# Patient Record
Sex: Female | Born: 1955
Health system: Southern US, Community
[De-identification: ages and names within clinical notes are randomized; demographics above are authoritative.]

## PROBLEM LIST (undated history)

## (undated) DIAGNOSIS — I10 Essential (primary) hypertension: Secondary | ICD-10-CM

## (undated) DIAGNOSIS — E785 Hyperlipidemia, unspecified: Secondary | ICD-10-CM

## (undated) HISTORY — DX: Hyperlipidemia, unspecified: E78.5

## (undated) HISTORY — DX: Essential (primary) hypertension: I10

## (undated) HISTORY — PX: OTHER SURGICAL HISTORY: SHX169

---

## 2003-01-19 LAB — HM COLONOSCOPY: HM Colonoscopy: NORMAL

## 2006-12-09 ENCOUNTER — Observation Stay (HOSPITAL_COMMUNITY): Admission: EM | Admit: 2006-12-09 | Discharge: 2006-12-10 | Payer: Self-pay | Admitting: Emergency Medicine

## 2006-12-20 ENCOUNTER — Ambulatory Visit: Payer: Self-pay

## 2009-03-03 ENCOUNTER — Encounter: Payer: Self-pay | Admitting: Family Medicine

## 2009-03-24 ENCOUNTER — Ambulatory Visit: Payer: Self-pay | Admitting: Family Medicine

## 2009-03-24 DIAGNOSIS — E119 Type 2 diabetes mellitus without complications: Secondary | ICD-10-CM | POA: Insufficient documentation

## 2009-03-24 DIAGNOSIS — E785 Hyperlipidemia, unspecified: Secondary | ICD-10-CM | POA: Insufficient documentation

## 2009-03-24 DIAGNOSIS — N39498 Other specified urinary incontinence: Secondary | ICD-10-CM | POA: Insufficient documentation

## 2009-03-24 DIAGNOSIS — I10 Essential (primary) hypertension: Secondary | ICD-10-CM | POA: Insufficient documentation

## 2009-03-24 LAB — CONVERTED CEMR LAB
Bilirubin Urine: NEGATIVE
Glucose, Urine, Semiquant: NEGATIVE
Ketones, urine, test strip: NEGATIVE
Specific Gravity, Urine: 1.015

## 2009-03-26 ENCOUNTER — Encounter: Payer: Self-pay | Admitting: Family Medicine

## 2009-03-26 ENCOUNTER — Encounter (INDEPENDENT_AMBULATORY_CARE_PROVIDER_SITE_OTHER): Payer: Self-pay | Admitting: *Deleted

## 2009-03-26 LAB — CONVERTED CEMR LAB: Casts: NONE SEEN /lpf

## 2009-04-16 ENCOUNTER — Telehealth: Payer: Self-pay | Admitting: Family Medicine

## 2009-05-12 ENCOUNTER — Encounter: Payer: Self-pay | Admitting: Family Medicine

## 2009-08-15 ENCOUNTER — Ambulatory Visit: Payer: Self-pay | Admitting: Family Medicine

## 2009-08-22 ENCOUNTER — Ambulatory Visit: Payer: Self-pay | Admitting: Family Medicine

## 2009-08-25 LAB — CONVERTED CEMR LAB
AST: 16 units/L (ref 0–37)
Alkaline Phosphatase: 82 units/L (ref 39–117)
BUN: 18 mg/dL (ref 6–23)
Creatinine, Ser: 0.74 mg/dL (ref 0.40–1.20)
Direct LDL: 123 mg/dL — ABNORMAL HIGH
Glucose, Bld: 111 mg/dL — ABNORMAL HIGH (ref 70–99)

## 2009-10-21 ENCOUNTER — Telehealth: Payer: Self-pay | Admitting: Family Medicine

## 2009-11-17 ENCOUNTER — Telehealth: Payer: Self-pay | Admitting: Family Medicine

## 2009-11-26 ENCOUNTER — Encounter: Admission: RE | Admit: 2009-11-26 | Discharge: 2009-11-26 | Payer: Self-pay | Admitting: Family Medicine

## 2009-12-22 ENCOUNTER — Ambulatory Visit: Payer: Self-pay | Admitting: Family Medicine

## 2009-12-22 DIAGNOSIS — R0989 Other specified symptoms and signs involving the circulatory and respiratory systems: Secondary | ICD-10-CM

## 2009-12-22 DIAGNOSIS — R0609 Other forms of dyspnea: Secondary | ICD-10-CM

## 2009-12-22 DIAGNOSIS — F341 Dysthymic disorder: Secondary | ICD-10-CM

## 2009-12-22 DIAGNOSIS — F339 Major depressive disorder, recurrent, unspecified: Secondary | ICD-10-CM | POA: Insufficient documentation

## 2009-12-22 LAB — CONVERTED CEMR LAB
Creatinine,U: 300 mg/dL
Hgb A1c MFr Bld: 5.9 %
Microalbumin U total vol: 10 mg/L

## 2009-12-24 ENCOUNTER — Encounter: Payer: Self-pay | Admitting: Family Medicine

## 2010-01-04 ENCOUNTER — Encounter: Payer: Self-pay | Admitting: Family Medicine

## 2010-01-07 ENCOUNTER — Telehealth: Payer: Self-pay | Admitting: Family Medicine

## 2010-01-14 ENCOUNTER — Ambulatory Visit: Payer: Self-pay | Admitting: Family Medicine

## 2010-02-08 ENCOUNTER — Encounter: Payer: Self-pay | Admitting: Emergency Medicine

## 2010-02-12 ENCOUNTER — Telehealth: Payer: Self-pay | Admitting: Family Medicine

## 2010-02-17 NOTE — Letter (Signed)
Summary: Primary Care Consult Scheduled Letter  [place] [facility]  [address]   [place], [place] [address]   Phone: [phone]  Fax: [phone]      03/26/2009 MRN: [id number]  [facility] [address] [place], [place]  [address]    Dear Ms. Blakney,      We have scheduled an appointment for you.  At the recommendation of Dr._Metheney , we have scheduled you a consult with [facility],[place]  on MARCH 30TH  at 11AM .  Their address is_[address]  [address], [place] . The office phone number is [phone] .  If this appointment day and time is not convenient for you, please feel free to call the office of the doctor you are being referred to at the number listed above and reschedule the appointment.     It is important for you to keep your scheduled appointments. We are here to make sure you are given good patient care. If you have questions or you have made changes to your appointment, please notify us at  [phone], ask for HELEN.    Thank you,  Patient Care Coordinator [place] [facility]

## 2010-02-17 NOTE — Progress Notes (Signed)
Summary: Metformin ?  Phone Note Call from Patient   Summary of Call: Pt would like to know when she should take her Metformin. She thinks she is to be taking 500mg  two times a day but just needs to know when the best time to take. Please advise. Initial call taken by: Payton Spark CMA,  April 16, 2009 3:27 PM  Follow-up for Phone Call        one in AM with breakfast adn second with dinner.  Follow-up by: Nani Gasser MD,  April 16, 2009 7:13 PM  Additional Follow-up for Phone Call Additional follow up Details #1::        Pt notified of MD instructions of Metformin Additional Follow-up by: Kathlene November,  April 17, 2009 9:02 AM

## 2010-02-17 NOTE — Assessment & Plan Note (Signed)
Summary: HTN, IFG   Vital Signs:  Patient profile:   55 year old female Height:      63 inches Weight:      145 pounds Pulse rate:   88 / minute BP sitting:   117 / 73  (left arm) Cuff size:   regular  Vitals Entered By: Avon Gully CMA, Duncan Dull) (August 22, 2009 8:38 AM) CC: f/u Bp and labs, Hypertension Management  Vision Screening:      Vision Comments: 06/2010   Primary Care Provider:  Nani Gasser, MD  CC:  f/u Bp and labs and Hypertension Management.  History of Present Illness: Did go to the diabetic classes.  Has really been work in gon her diet.  Was due for A1C.  Had her eye exam about 2 months ago.     Diabetes Management History:      She says that she is exercising.  Type of exercise includes: treadmill.  Duration of exercise is estimated to be 30-60  She is doing this 3 times per week.    Hypertension History:      She denies headache, chest pain, palpitations, dyspnea with exertion, orthopnea, PND, peripheral edema, visual symptoms, neurologic problems, syncope, and side effects from treatment.  She notes no problems with any antihypertensive medication side effects.        Positive major cardiovascular risk factors include female age 48 years old or older, hyperlipidemia, hypertension, and current tobacco user.     Current Medications (verified): 1)  Lisinopril 10 Mg Tabs (Lisinopril) .... Take One Tablet By Mouth Once A Day 2)  Lipitor 80 Mg Tabs (Atorvastatin Calcium) .... Take One Tablet By Mouth Daily 3)  Metformin Hcl 500 Mg Tabs (Metformin Hcl) .... Take One Tablet By Mouth Twice A Day 4)  Prempro 0.625-5 Mg Tabs (Conj Estrog-Medroxyprogest Ace) .... Take Three Times A Week 5)  Venlafaxine Hcl 75 Mg Xr24h-Tab (Venlafaxine Hcl) .... Take 1 Tablet By Mouth Once A Day  Allergies (verified): 1)  ! Codeine  Comments:  Nurse/Medical Assistant: The patient's medications and allergies were reviewed with the patient and were updated in the  Medication and Allergy Lists. Avon Gully CMA, Duncan Dull) (August 22, 2009 8:39 AM)  Physical Exam  General:  Well-developed,well-nourished,in no acute distress; alert,appropriate and cooperative throughout examination Lungs:  Normal respiratory effort, chest expands symmetrically. Lungs are clear to auscultation, no crackles or wheezes. Heart:  Normal rate and regular rhythm. S1 and S2 normal without gallop, murmur, click, rub or other extra sounds. Skin:  no rashes.   Psych:  Cognition and judgment appear intact. Alert and cooperative with normal attention span and concentration. No apparent delusions, illusions, hallucinations  Diabetes Management Exam:    Eye Exam:       Eye Exam done elsewhere          Date: 06/18/2009          Results: normal          Done by: Dr. Clearance Coots   Impression & Recommendations:  Problem # 1:  HYPERTENSION, BENIGN (ICD-401.1) Exam is normal. BP looks great.  Her updated medication list for this problem includes:    Lisinopril 10 Mg Tabs (Lisinopril) .Marland Kitchen... Take one tablet by mouth once a day  Orders: T-Comprehensive Metabolic Panel ([id number])  Problem # 2:  INSULIN RESISTANCE SYNDROME (ICD-259.8) Due for A1C. Eye exam is up to date. Doing well on the metform iwht no SE.  Will check microalbumin at the next OV.  Orders: T- * Misc. Laboratory test [phone])  Complete Medication List: 1)  Lisinopril 10 Mg Tabs (Lisinopril) .... Take one tablet by mouth once a day 2)  Lipitor 80 Mg Tabs (Atorvastatin calcium) .... Take one tablet by mouth daily 3)  Metformin Hcl 500 Mg Tabs (Metformin hcl) .... Take one tablet by mouth twice a day 4)  Prempro 0.625-5 Mg Tabs (Conj estrog-medroxyprogest ace) .... Take three times a week 5)  Venlafaxine Hcl 75 Mg Xr24h-tab (Venlafaxine hcl) .... Take 1 tablet by mouth once a day 6)  Delica Lancets.  .... Dx: 250.00 test 1 x a day.  Other Orders: T-LDL Direct [phone])  Hypertension Assessment/Plan:      The  patient's hypertensive risk group is category B: At least one risk factor (excluding diabetes) with no target organ damage.  Today's blood pressure is 117/73.    Patient Instructions: 1)  Please schedule a follow-up appointment in 3 months for sugars.  Prescriptions: DELICA LANCETS. Dx: 250.00 Test 1 x a day.  #90 x 3   Entered and Authorized by:   Nani Gasser MD   Signed by:   Nani Gasser MD on 08/22/2009   Method used:   Printed then faxed to ...       [facility] (mail-order)             , [place]         Ph: [phone]       Fax: [phone]   RxID:   [id number] VENLAFAXINE HCL 75 MG XR24H-TAB (VENLAFAXINE HCL) Take 1 tablet by mouth once a day  #90 x 3   Entered and Authorized by:   Nani Gasser MD   Signed by:   Nani Gasser MD on 08/22/2009   Method used:   Printed then faxed to ...       [facility] (mail-order)             , [place]         Ph: [phone]       Fax: [phone]   RxID:   [id number] LIPITOR 80 MG TABS (ATORVASTATIN CALCIUM) Take one tablet by mouth daily  #90 x 3   Entered and Authorized by:   Nani Gasser MD   Signed by:   Nani Gasser MD on 08/22/2009   Method used:   Printed then faxed to ...       [facility] (mail-order)             , [place]         Ph: [phone]       Fax: [phone]   RxID:   [id number]

## 2010-02-17 NOTE — Letter (Signed)
Summary: Patient Health Questionnaire, Anxiety Disorder Scale  Patient Health Questionnaire, Anxiety Disorder Scale   Imported By: Maryln Gottron 12/26/2009 14:21:49  _____________________________________________________________________  External Attachment:    Type:   Image     Comment:   External Document

## 2010-02-17 NOTE — Assessment & Plan Note (Signed)
Summary: follow-up blood sugars, mood, snoring   Vital Signs:  Patient profile:   55 year old female Height:      63 inches Weight:      146 pounds BMI:     25.96 Pulse rate:   83 / minute BP sitting:   131 / 77  (right arm) Cuff size:   regular  Vitals Entered By: Avon Gully CMA, Duncan Dull) (December 22, 2009 8:29 AM) CC: f/u fasting glucose   Primary Care Provider:  Nani Gasser, MD  CC:  f/u fasting glucose.  History of Present Illness: Taking venlafaxine for the last 2 years. Always has difficulty in the winter. But hasnt' felt well in the last 6 months. Feels she is depressed and anxious. Doesn't want to engage and get out of the house. Can't sleep.  she also feels very anxious and irritable especially at work.  She is having some difficulty with her husband mostly because she just doesn't want to engage her communicate very much.  She denies feeling suicidal.  She really notes her symptoms started to worsen over the last 6 months.  The last two weeks in particular have been more severe.she has not had any other mood medications in the past.her sleep quality is extremely poor.  Current Medications (verified): 1)  Lisinopril 10 Mg Tabs (Lisinopril) .... Take One Tablet By Mouth Once A Day 2)  Lipitor 80 Mg Tabs (Atorvastatin Calcium) .... Take One Tablet By Mouth Daily 3)  Metformin Hcl 500 Mg Xr24h-Tab (Metformin Hcl) .... Take 1 Tablet By Mouth Once A Day 4)  Prempro 0.625-5 Mg Tabs (Conj Estrog-Medroxyprogest Ace) .... Take Three Times A Week 5)  Venlafaxine Hcl 75 Mg Xr24h-Tab (Venlafaxine Hcl) .... Take 1 Tablet By Mouth Once A Day 6)  Delica Lancets. .... Dx: 250.00 Test 1 X A Day.  Allergies (verified): 1)  ! Codeine  Comments:  Nurse/Medical Assistant: The patient's medications and allergies were reviewed with the patient and were updated in the Medication and Allergy Lists. Avon Gully CMA, Duncan Dull) (December 22, 2009 8:29 AM)  Physical  Exam  General:  Well-developed,well-nourished,in no acute distress; alert,appropriate and cooperative throughout examination Lungs:  Normal respiratory effort, chest expands symmetrically. Lungs are clear to auscultation, no crackles or wheezes. Heart:  Normal rate and regular rhythm. S1 and S2 normal without gallop, murmur, click, rub or other extra sounds. Psych:  she is tearful on exam today.  Good eye contact.  She is not anxious appearing.   Impression & Recommendations:  Problem # 1:  INSULIN RESISTANCE SYNDROME (ICD-259.8) her A1c looks wonderful today.  I really intended to stop her metformin and see how she does.  Then she has stopped exercising of the last two months secondary to her mood.  When she is back into her regular routine exercising again hopefully as she feels more motivated than I would like to stop her metformin and see how she does. Orders: Fingerstick (82956) Creatinine  (21308) Hemoglobin A1C (83036) Urine Microalbumin (65784)  Problem # 2:  ANXIETY DEPRESSION (ICD-300.4) Assessment: New PHQ- 9 scorew 16 (mod severe) GAD-7 score 10. we discussed several different options.  We could change her to an SSRI, started mood stabilizer, or increase her venlafaxine.  After looking at her scores I decided to work on increasing her venlafaxine and for the short-term and it stabilizer in the evening to improve her sleep quality and help with her mood.  Actually the Seroquel will be for one to two  months as the increased and venlafaxine and helps her mood. if we can stay with this medication it will also help her perimenopausal symptoms.  If not then we will change to another SSRI.  She is not tried any other mood medications in the past.patient might agree to this particular care plan.  Follow-up in 3 weeks.  Problem # 3:  SNORING (ICD-786.09) per her report husband she snores nightly.  He feels it is gotten worse.  Recently her sister was diagnosed with sleep apnea.  She would  like to be tested.  All her make a for referral to schedule her for a sleep study. Her updated medication list for this problem includes:    Lisinopril 10 Mg Tabs (Lisinopril) .Marland Kitchen... Take one tablet by mouth once a day  Orders: Sleep Study (Sleep Study)  Complete Medication List: 1)  Lisinopril 10 Mg Tabs (Lisinopril) .... Take one tablet by mouth once a day 2)  Lipitor 80 Mg Tabs (Atorvastatin calcium) .... Take one tablet by mouth daily 3)  Metformin Hcl 500 Mg Xr24h-tab (Metformin hcl) .... Take 1 tablet by mouth once a day 4)  Prempro 0.625-2.5 Mg Tabs (Conj estrog-medroxyprogest ace) .... Take 1 tablet by mouth once a day 5)  Venlafaxine Hcl 150 Mg Xr24h-cap (Venlafaxine hcl) .... Take 1 tablet by mouth once a day 6)  Delica Lancets.  .... Dx: 250.00 test 1 x a day. 7)  Seroquel 50 Mg Tabs (Quetiapine fumarate) .... 1/2 tab at bedtime for one week then increase to whole tab.  Patient Instructions: 1)  Increase venlafaxine to 2 tabs daily and then when run out pick up rx for the 150mg  once a day tab 2)  Start the seroquel 1/2 tab after dinner time.  3)  Please schedule a follow-up appointment in 3 weeks.  Prescriptions: PREMPRO 0.625-2.5 MG TABS (CONJ ESTROG-MEDROXYPROGEST ACE) Take 1 tablet by mouth once a day  #90 x 3   Entered and Authorized by:   Nani Gasser MD   Signed by:   Nani Gasser MD on 12/22/2009   Method used:   Electronically to        [facility]* (retail)             ,          Ph: [phone]       Fax: [phone]   RxID:   [id number] SEROQUEL 50 MG TABS (QUETIAPINE FUMARATE) 1/2 tab at bedtime for one week then increase to whole tab.  #30 x 0   Entered and Authorized by:   Nani Gasser MD   Signed by:   Nani Gasser MD on 12/22/2009   Method used:   Electronically to        [facility]* (retail)       [address]       [place], [place]  [address]       Ph: [phone]       Fax: [phone]    RxID:   [id number] VENLAFAXINE HCL 150 MG XR24H-CAP (VENLAFAXINE HCL) Take 1 tablet by mouth once a day  #30 x 0   Entered and Authorized by:   Nani Gasser MD   Signed by:   Nani Gasser MD on 12/22/2009   Method used:   Electronically to        [facility]* (retail)       [address]       [place], [place]  [address]  Ph: [phone]       Fax: [phone]   RxID:   [id number]    Orders Added: 1)  Fingerstick [36416] 2)  Creatinine  [82570] 3)  Hemoglobin A1C [83036] 4)  Urine Microalbumin [82044] 5)  Sleep Study [Sleep Study] 6)  Est. Patient Level IV [62952]    Laboratory Results   Urine Tests  Date/Time Received: 12/22/09 Date/Time Reported: 12/22/09  Microalbumin (urine): 10 mg/L Creatinine: 300mg /dL  A:C Ratio 30mg /g  Blood Tests   Date/Time Received: 12/22/09 Date/Time Reported: 12/22/09  HGBA1C: 5.9%   (Normal Range: Non-Diabetic - 3-6%   Control Diabetic - 6-8%)

## 2010-02-17 NOTE — Assessment & Plan Note (Signed)
Summary: NOV: est care   Vital Signs:  Patient profile:   55 year old female Height:      63 inches Weight:      148 pounds BMI:     26.31 Pulse rate:   97 / minute BP sitting:   136 / 84  (left arm) Cuff size:   regular  Vitals Entered By: Kathlene November (March 24, 2009 4:12 PM) CC: Np- get established- discuss bladder control issues Is Patient Diabetic? Yes Did you bring your meter with you today? No   Primary Care Provider:  Nani Gasser, MD  CC:  Np- get established- discuss bladder control issues.  History of Present Illness: Np- get established- discuss bladder control issues. Started notes some sxs about 30 years ago.  Over the last few weeks noticed some leaking with cough, laughing and sneezing. over the last few weeks has noticed having more urgency. But this is new.  Works in [employer].     Habits & Providers  Alcohol-Tobacco-Diet     Alcohol drinks/day: 1     Alcohol type: wine     Tobacco Status: current     Cigarette Packs/Day: <0.25  Exercise-Depression-Behavior     Does Patient Exercise: yes     Type of exercise: treadmill     Exercise (avg: min/session): 30-60     Times/week: 3     STD Risk: never     Drug Use: no     Seat Belt Use: always  Current Medications (verified): 1)  Lisinopril 10 Mg Tabs (Lisinopril) .... Take One Tablet By Mouth Once A Day 2)  Lipitor 80 Mg Tabs (Atorvastatin Calcium) .... Take One Tablet By Mouth Daily 3)  Metformin Hcl 500 Mg Tabs (Metformin Hcl) .... Take One Tablet By Mouth Twice A Day 4)  Prempro 0.625-5 Mg Tabs (Conj Estrog-Medroxyprogest Ace) .... Take Three Times A Week 5)  Venlafaxine Hcl 75 Mg Tabs (Venlafaxine Hcl) .... Take One Tablet By Mouth Once A Day  Allergies (verified): 1)  ! 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 [employer]  diploma and some college. Married to [place]inkley with 2 kids.  Current Smoker Alcohol use-yes Drug use-no Regular exercise-yes 2 caffeinated drinks per day.  Smoking Status:  current Packs/Day:  <0.25 Does Patient Exercise:  yes STD Risk:  never Drug Use:  no Seat Belt Use:  always  Review of Systems       No fever/sweats/weakness, unexplained weight loss/gain.  + vison changes.  No difficulty hearing/ringing in ears, hay fever/allergies.  No chest pain/discomfort, palpitations.  No Br lump/nipple discharge.  No cough/wheeze.  No blood in BM, nausea/vomiting/diarrhea.  No nighttime urination, +  leaking urine, no unusual vaginal bleeding, discharge (penis or vagina).  No muscle/joint pain. No rash, change in mole.  No HA, memory loss.  + anxiety, no sleep d/o, depression.  No easy bruising/bleeding, unexplained lump   Physical Exam  General:  Well-developed,well-nourished,in no acute distress; alert,appropriate and cooperative throughout examination Head:  Normocephalic and atraumatic without obvious abnormalities. No apparent alopecia or balding. Eyes:  No corneal or conjunctival inflammation noted. EOMI. Perrla.  Lungs:  Normal respiratory effort, chest expands symmetrically. Lungs are clear to auscultation, no crackles or wheezes. Heart:  Normal rate and regular rhythm. S1 and S2 normal without gallop, murmur, click,  rub or other extra sounds. Skin:  no rashes.   Cervical Nodes:  No lymphadenopathy noted Psych:  Cognition and judgment appear intact. Alert and cooperative with normal attention span and concentration. No apparent delusions, illusions, hallucinations   Impression & Recommendations:  Problem # 1:  INSULIN RESISTANCE SYNDROME (ICD-259.8) Dsicussed referring for nutrition counseling. She is already on metformin and her A1C is 6.4 which is well controlled but says she has never been told she is officically diabetic so this is a little confusing. I did explain that mefformin  does improve sensitivity of the body's cells to insulin. She is really encouraged to work on diet and exericse to try to get off the metformin.   Orders: Nutrition Referral (Nutrition).  Problem # 2:  HYPERLIPIDEMIA (ICD-272.4) LDL 101 on lipitor 80mg . Discussed diet and exercise to get her LDL down a few more points vs increasing in her medication. She says this runs in her family.  Her updated medication list for this problem includes:    Lipitor 80 Mg Tabs (Atorvastatin calcium) .Marland Kitchen... Take one tablet by mouth daily  Problem # 3:  STRESS INCONTINENCE (ICD-788.39) Checked UA since signs renctly worse.  UA + for LE and blood. Send for micro and culture. If neg then will refer for PT for stress incontinence. We discussed the difference b/t SI and OAB.  Will refer in [place].  Orders: Physical Therapy Referral (PT)  Complete Medication List: 1)  Lisinopril 10 Mg Tabs (Lisinopril) .... Take one tablet by mouth once a day 2)  Lipitor 80 Mg Tabs (Atorvastatin calcium) .... Take one tablet by mouth daily 3)  Metformin Hcl 500 Mg Tabs (Metformin hcl) .... Take one tablet by mouth twice a day 4)  Prempro 0.625-5 Mg Tabs (Conj estrog-medroxyprogest ace) .... Take three times a week 5)  Venlafaxine Hcl 75 Mg Xr24h-tab (Venlafaxine hcl) .... Take 1 tablet by mouth once a day  Other Orders: UA Dipstick w/o Micro (automated)  (81003) T-Urine Microscopic [id number]) T-Urine Culture (Spectrum Order) [id number] [phone])  Laboratory Results   Urine Tests  Date/Time Received: 37/2011 Date/Time Reported: 03/24/2009  Routine Urinalysis   Color: yellow Appearance: Clear Glucose: negative   (Normal Range: Negative) Bilirubin: negative   (Normal Range: Negative) Ketone: negative   (Normal Range: Negative) Spec. Gravity: 1.015   (Normal Range: 1.003-1.035) Blood: small   (Normal Range: Negative) pH: 7.0   (Normal Range: 5.0-8.0) Protein: negative   (Normal Range: Negative) Urobilinogen: 0.2   (Normal Range:  0-1) Nitrite: negative   (Normal Range: Negative) Leukocyte Esterace: trace   (Normal Range: Negative)       TD Result Date:  01/18/2006 TD Result:  given TD Next Due:  10 yr Flex Sig Next Due:  Not Indicated Colonoscopy Result Date:  01/19/2003 Colonoscopy Result:  normal Colonoscopy Next Due:  10 yr Hemoccult Next Due:  Not Indicated PAP Result Date:  01/18/2009 Mammogram Result Date:  01/18/2009

## 2010-02-17 NOTE — Letter (Signed)
Summary: [facility]  [facility]   Imported By: Lanelle Bal 05/15/2009 12:03:07  _____________________________________________________________________  External Attachment:    Type:   Image     Comment:   External Document

## 2010-02-17 NOTE — Assessment & Plan Note (Signed)
Summary: BLOODWORK  Nurse Visit   Vitals Entered By: Avon Gully CMA, Duncan Dull) (August 15, 2009 8:15 AM) Comments pt was not fasting and instructed pt to schedule appt for full diabeted work up as she was due.acm   Allergies: 1)  ! Codeine Laboratory Results   Blood Tests   Date/Time Received: 08/14/09 Date/Time Reported: 08/14/09  Glucose (random): 160 mg/dL   (Normal Range: 62-130)     Orders Added: 1)  Fingerstick [36416] 2)  Capillary Blood Glucose/CBG [86578]

## 2010-02-17 NOTE — Progress Notes (Signed)
Summary: Bone density  Phone Note Call from Patient Call back at [phone]   Caller: Patient Call For: Nani Gasser MD Summary of Call: Pt calls and wants to get a bone density done in [place]. Need order Initial call taken by: Kathlene November LPN,  October 21, 2009 4:27 PM  Follow-up for Phone Call        Order printed.  Follow-up by: Nani Gasser MD,  October 21, 2009 5:30 PM  Additional Follow-up for Phone Call Additional follow up Details #1::        will schedule Additional Follow-up by: Kathlene November LPN,  October 22, 2009 8:06 AM

## 2010-02-17 NOTE — Progress Notes (Signed)
Summary: Metformin  Phone Note Call from Patient Call back at [phone]   Caller: Patient Call For: Nani Gasser MD Summary of Call: Pt calls wanting a refill on the Metformin HCL ER 500mg . Says you have not filled this for her yet was getting from old MD. We have her as being on Metformin HCL 500mg  two times a day. If ok to change please put in and I will send to [facility] Initial call taken by: Kathlene November LPN,  November 17, 2009 8:45 AM  Follow-up for Phone Call        Rx Called In Follow-up by: Nani Gasser MD,  November 17, 2009 9:31 AM    New/Updated Medications: METFORMIN HCL 500 MG XR24H-TAB (METFORMIN HCL) Take 1 tablet by mouth once a day Prescriptions: METFORMIN HCL 500 MG XR24H-TAB (METFORMIN HCL) Take 1 tablet by mouth once a day  #90 x 1   Entered and Authorized by:   Nani Gasser MD   Signed by:   Nani Gasser MD on 11/17/2009   Method used:   Electronically to        [facility]* (retail)             ,          Ph: [phone]       Fax: [phone]   RxID:   [id number]

## 2010-02-17 NOTE — Letter (Signed)
Summary: Records Dated 01-30-08 thru 03-06-09/[facility] & Caryl Asp  Records Dated 01-30-08 thru 03-06-09/Allison [facility]ider & Anthoney Harada MD   Imported By: Lanelle Bal 04/03/2009 12:24:20  _____________________________________________________________________  External Attachment:    Type:   Image     Comment:   External Document

## 2010-02-19 NOTE — Progress Notes (Signed)
Summary: Medication refills  Phone Note Call from Patient   Summary of Call: Pt would like new 90-day rx's sent to local pharm for all meds as her insurance has changed.  Pt has a follow up appt on 2/29. Pt also needs OneTouch Delica test strips and lancets.  Meds verified on med list.  Is it OK to refill all these meds. Initial call taken by: Francee Piccolo CMA Duncan Dull),  February 12, 2010 2:55 PM  Follow-up for Phone Call        Rx Called In Follow-up by: Nani Gasser MD,  February 12, 2010 3:04 PM  Additional Follow-up for Phone Call Additional follow up Details #1::        notified pt all rx's have been called in. Additional Follow-up by: Francee Piccolo CMA Duncan Dull),  February 12, 2010 4:02 PM    New/Updated Medications: * DELICA LANCETS AND TEST STRIPS Dx: 250.00 Test 1 x a day. Prescriptions: DELICA LANCETS AND TEST STRIPS Dx: 250.00 Test 1 x a day.  #90 x 3   Entered and Authorized by:   Nani Gasser MD   Signed by:   Nani Gasser MD on 02/12/2010   Method used:   Printed then faxed to ...       [facility]  [place] [phone][facility] (retail[facility]       [address] [place], [place]  [address]       Ph: [phone] or [phone]       Fax: [phone]   RxID:   [id number] SEROQUEL 50 MG TABS (QUETIAPINE FUMARATE) Take 1 tablet by mouth once a day  #90 x 0   Entered and Authorized by:   Nani Gasser MD   Signed by:   Nani Gasser MD on 02/12/2010   Method used:   Electronically to        [facility] [phone]* (retail)       [address] [place], [place]  [address]       Ph: [phone] or [phone]       Fax: [phone]   RxID:   [id number] VENLAFAXINE HCL 150 MG XR24H-CAP (VENLAFAXINE HCL) Take 1 tablet by mouth once a day  #90 x 0   Entered and Authorized by:   Nani Gasser MD   Signed by:   Nani Gasser MD on 02/12/2010   Method used:   Electronically to        [facility] [phone]* (retail)       [address]  [place], [place]  [address]       Ph: [phone] or [phone]       Fax: [phone]   RxID:   [id number] PREMPRO 0.625-2.5 MG TABS (CONJ ESTROG-MEDROXYPROGEST ACE) Take 1 tablet by mouth once a day  #90 x 1   Entered and Authorized by:   Nani Gasser MD   Signed by:   Nani Gasser MD on 02/12/2010   Method used:   Electronically to        [facility] [phone]* (retail)       [address] [place], [place]  [address]       Ph: [phone] or [phone]       Fax: [phone]   RxID:   [id number] METFORMIN HCL 500 MG XR24H-TAB (METFORMIN HCL) Take 1 tablet  by mouth once a day  #90 x 1   Entered and Authorized by:   Nani Gasser MD   Signed by:   Nani Gasser MD on 02/12/2010   Method used:   Electronically to        [facility] [phone]* (retail)       [address] [place], [place]  [address]       Ph: [phone] or [phone]       Fax: [phone]   RxID:   [id number] LIPITOR 80 MG TABS (ATORVASTATIN CALCIUM) Take one tablet by mouth daily  #90 x 1   Entered and Authorized by:   Nani Gasser MD   Signed by:   Nani Gasser MD on 02/12/2010   Method used:   Electronically to        [facility] [phone]* (retail)       [address] [place], [place]  [address]       Ph: [phone] or [phone]       Fax: [phone]   RxID:   [id number] LISINOPRIL 10 MG TABS (LISINOPRIL) take one tablet by mouth once a day  #90 x 1   Entered and Authorized by:   Nani Gasser MD   Signed by:   Nani Gasser MD on 02/12/2010   Method used:   Electronically to        [facility] [phone]* (retail)       [address] [place], [place]  [address]       Ph: [phone] or [phone]       Fax: [phone]   RxID:   [id number]

## 2010-02-19 NOTE — Letter (Signed)
Summary: Depression Questionnaire  Depression Questionnaire   Imported By: Lanelle Bal 01/26/2010 11:55:24  _____________________________________________________________________  External Attachment:    Type:   Image     Comment:   External Document

## 2010-02-19 NOTE — Progress Notes (Signed)
Summary: sleep study results  Phone Note Outgoing Call   Summary of Call: Call pt: Sleep study shows and minimal sleep apnea.  She did not drop her oxygen level.  She did have some mild snoring.  She also had some mild jerking of her arms and legs.  She woke up very frequently.  She did have a slight increase in heart rate overnight as well.  At this point I do not recommend any further treatment.  The apnea is mild enough that it does not require treatment.  Nor do the limb jerks or snoring.is important to maintain a healthy way he said that the sleep apnea does not get worse. Initial call taken by: Nani Gasser MD,  January 07, 2010 1:02 PM  Follow-up for Phone Call        pt notified Follow-up by: Avon Gully CMA, Duncan Dull),  January 07, 2010 1:58 PM

## 2010-02-19 NOTE — Assessment & Plan Note (Signed)
Summary: 3 WK F/UP Mood   Vital Signs:  Patient profile:   55 year old female Height:      63 inches Weight:      148 pounds BMI:     26.31 O2 Sat:      97 % on Room air Pulse rate:   100 / minute BP sitting:   127 / 71  (left arm) Cuff size:   regular  Vitals Entered By: Payton Spark CMA (January 14, 2010 10:48 AM)  O2 Flow:  Room air CC: F/U mood. Doing well.    Primary Care Provider:  Nani Gasser, MD  CC:  F/U mood. Doing well. Marland Kitchen  History of Present Illness: Doing very well on teh seroquel.  Sleeping the best she has in years. No SE if the medicaiton.  No problems with inc on Effexor.  She is very happy with the resutls. She denies any negative effects. She is most impressed with the improvemen of sleep.   Allergies: 1)  ! Codeine  Physical Exam  General:  Well-developed,well-nourished,in no acute distress; alert,appropriate and cooperative throughout examination Lungs:  Normal respiratory effort, chest expands symmetrically. Lungs are clear to auscultation, no crackles or wheezes. Heart:  Normal rate and regular rhythm. S1 and S2 normal without gallop, murmur, click, rub or other extra sounds.   Impression & Recommendations:  Problem # 1:  ANXIETY DEPRESSION (ICD-300.4) PHQ-9 score down to 2 from 16.  She really has had sig improvemtn. Recommend continue current regimen for 1-2 more months and then hopefully can wean the seroquel. Will watch her weight. Up 2 lbs today.    Problem # 2:  SNORING (ICD-786.09) Reviewd the results of her sleep studay. Her sxs were minimal and recommend keep a healthy weight.  Her updated medication list for this problem includes:    Lisinopril 10 Mg Tabs (Lisinopril) .Marland Kitchen... Take one tablet by mouth once a day  Complete Medication List: 1)  Lisinopril 10 Mg Tabs (Lisinopril) .... Take one tablet by mouth once a day 2)  Lipitor 80 Mg Tabs (Atorvastatin calcium) .... Take one tablet by mouth daily 3)  Metformin Hcl 500 Mg  Xr24h-tab (Metformin hcl) .... Take 1 tablet by mouth once a day 4)  Prempro 0.625-2.5 Mg Tabs (Conj estrog-medroxyprogest ace) .... Take 1 tablet by mouth once a day 5)  Venlafaxine Hcl 150 Mg Xr24h-cap (Venlafaxine hcl) .... Take 1 tablet by mouth once a day 6)  Delica Lancets.  .... Dx: 250.00 test 1 x a day. 7)  Seroquel 50 Mg Tabs (Quetiapine fumarate) .... Take 1 tablet by mouth once a day  Patient Instructions: 1)  Follow up in 1-2 months and will consider weaning seroquel at that time.  Prescriptions: SEROQUEL 50 MG TABS (QUETIAPINE FUMARATE) Take 1 tablet by mouth once a day  #30 x 2   Entered and Authorized by:   Nani Gasser MD   Signed by:   Nani Gasser MD on 01/14/2010   Method used:   Electronically to        [facility] [phone]* (retail)       [address] [place], [place]  [address]       Ph: [phone] or [phone]       Fax: [phone]   RxID:   [id number] VENLAFAXINE HCL 150 MG XR24H-CAP (VENLAFAXINE HCL) Take 1 tablet by mouth once a day  #30 x 3   Entered and Authorized by:  Nani Gasser MD   Signed by:   Nani Gasser MD on 01/14/2010   Method used:   Electronically to        [facility] [phone]* (retail)       [address] [place], [place]  [address]       Ph: [phone] or [phone]       Fax: [phone]   RxID:   [id number]    Orders Added: 1)  Est. Patient Level III [phone]

## 2010-02-19 NOTE — Letter (Signed)
Summary: Appt Scheduled/[facility]  Appt Scheduled/[facility]   Imported By: Lanelle Bal 01/02/2010 11:15:28  _____________________________________________________________________  External Attachment:    Type:   Image     Comment:   External Document

## 2010-03-17 ENCOUNTER — Ambulatory Visit: Payer: Self-pay | Admitting: Family Medicine

## 2010-03-18 ENCOUNTER — Ambulatory Visit (INDEPENDENT_AMBULATORY_CARE_PROVIDER_SITE_OTHER): Payer: 59 | Admitting: Family Medicine

## 2010-03-18 ENCOUNTER — Encounter: Payer: Self-pay | Admitting: Family Medicine

## 2010-03-18 DIAGNOSIS — F341 Dysthymic disorder: Secondary | ICD-10-CM

## 2010-03-18 DIAGNOSIS — I10 Essential (primary) hypertension: Secondary | ICD-10-CM

## 2010-03-19 ENCOUNTER — Encounter: Payer: Self-pay | Admitting: Family Medicine

## 2010-03-19 LAB — CONVERTED CEMR LAB
AST: 16 units/L (ref 0–37)
Albumin: 4.2 g/dL (ref 3.5–5.2)
BUN: 14 mg/dL (ref 6–23)
Calcium: 8.9 mg/dL (ref 8.4–10.5)
Chloride: 105 meq/L (ref 96–112)
Creatinine, Ser: 0.74 mg/dL (ref 0.40–1.20)
Glucose, Bld: 93 mg/dL (ref 70–99)
HDL: 56 mg/dL (ref >39)
TSH: 2.858 microintl units/mL (ref 0.350–4.500)
Total CHOL/HDL Ratio: 3.3
Triglycerides: 189 mg/dL — ABNORMAL HIGH (ref <150)

## 2010-03-26 NOTE — Assessment & Plan Note (Signed)
Summary: F/U MOOD, HTN, labs   Vital Signs:  Patient profile:   55 year old female Height:      63 inches Weight:      149 pounds Pulse rate:   105 / minute BP sitting:   139 / 84  (right arm) Cuff size:   regular  Vitals Entered By: Avon Gully CMA, Duncan Dull) (March 18, 2010 8:23 AM) CC: f/u mood   Primary Care Provider:  Nani Gasser, MD  CC:  f/u mood.  History of Present Illness: Doing well overall.  Mood is goo and she feels she is sleeping a little too much with the seroquel. Dropped to half a tab for a month.  She is worried she may not sleep wel once stops the seroquel.   Check BP at home.  Had BP 170/89 over the weekend. Has noticed a trend voer the last couple of week.   Current Medications (verified): 1)  Lisinopril 10 Mg Tabs (Lisinopril) .... Take One Tablet By Mouth Once A Day 2)  Lipitor 80 Mg Tabs (Atorvastatin Calcium) .... Take One Tablet By Mouth Daily 3)  Metformin Hcl 500 Mg Xr24h-Tab (Metformin Hcl) .... Take 1 Tablet By Mouth Once A Day 4)  Prempro 0.625-2.5 Mg Tabs (Conj Estrog-Medroxyprogest Ace) .... Take 1 Tablet By Mouth Once A Day 5)  Venlafaxine Hcl 150 Mg Xr24h-Cap (Venlafaxine Hcl) .... Take 1 Tablet By Mouth Once A Day 6)  Delica Lancets and Test Strips .... Dx: 250.00 Test 1 X A Day. 7)  Seroquel 50 Mg Tabs (Quetiapine Fumarate) .... Take 1 Tablet By Mouth Once A Day  Allergies (verified): 1)  ! Codeine  Comments:  Nurse/Medical Assistant: The patient's medications and allergies were reviewed with the patient and were updated in the Medication and Allergy Lists. Avon Gully CMA, Duncan Dull) (March 18, 2010 8:24 AM)  Physical Exam  General:  Well-developed,well-nourished,in no acute distress; alert,appropriate and cooperative throughout examination Lungs:  Normal respiratory effort, chest expands symmetrically. Lungs are clear to auscultation, no crackles or wheezes. Heart:  Normal rate and regular rhythm. S1 and S2 normal  without gallop, murmur, click, rub or other extra sounds.   Impression & Recommendations:  Problem # 1:  ANXIETY DEPRESSION (ICD-300.4) PHQ-9 score 1. She id doing great. Stop the serquel and we can even consider weaning the effexor if a couple of months if she would like.   Problem # 2:  HYPERTENSION, BENIGN (ICD-401.1) Will inc lisin to 20mg   F/U to recheck in 1 month and she can follow it at home.  Her updated medication list for this problem includes:    Lisinopril 20 Mg Tabs (Lisinopril) .Marland Kitchen... Take 1 tablet by mouth once a day  Orders: T-Comprehensive Metabolic Panel ([id number]) T-TSH ([id number])  Complete Medication List: 1)  Lisinopril 20 Mg Tabs (Lisinopril) .... Take 1 tablet by mouth once a day 2)  Lipitor 80 Mg Tabs (Atorvastatin calcium) .... Take one tablet by mouth daily 3)  Metformin Hcl 500 Mg Xr24h-tab (Metformin hcl) .... Take 1 tablet by mouth once a day 4)  Prempro 0.625-2.5 Mg Tabs (Conj estrog-medroxyprogest ace) .... Take 1 tablet by mouth once a day 5)  Venlafaxine Hcl 150 Mg Xr24h-cap (Venlafaxine hcl) .... Take 1 tablet by mouth once a day 6)  Delica Lancets and Test Strips  .... Dx: 250.00 test 1 x a day.  Other Orders: T-Lipid Profile ([id number])  Patient Instructions: 1)  Please schedule a follow-up appointment in 1 month for Blood pressure.  2)  Can stop the seroquel Prescriptions: LISINOPRIL 20 MG TABS (LISINOPRIL) Take 1 tablet by mouth once a day  #30 x 1   Entered and Authorized by:   Nani Gasser MD   Signed by:   Nani Gasser MD on 03/18/2010   Method used:   Electronically to        [facility] [phone]* (retail)       [address] [place], [place]  [address]       Ph: [phone] or [phone]       Fax: [phone]   RxID:   [id number]    Orders Added: 1)  T-Comprehensive Metabolic Panel [[id number]] 2)  T-Lipid Profile [[id number]] 3)  T-TSH [[id number]] 4)  Est. Patient Level III [[address]]

## 2010-04-07 NOTE — Letter (Signed)
Summary: Patient Health Questionnaire  Patient Health Questionnaire   Imported By: Maryln Gottron 03/31/2010 12:45:48  _____________________________________________________________________  External Attachment:    Type:   Image     Comment:   External Document

## 2010-04-15 ENCOUNTER — Ambulatory Visit: Payer: 59 | Admitting: Family Medicine

## 2010-04-19 ENCOUNTER — Encounter: Payer: Self-pay | Admitting: Family Medicine

## 2010-04-22 ENCOUNTER — Encounter: Payer: Self-pay | Admitting: Family Medicine

## 2010-04-22 ENCOUNTER — Ambulatory Visit (INDEPENDENT_AMBULATORY_CARE_PROVIDER_SITE_OTHER): Payer: 59 | Admitting: Family Medicine

## 2010-04-22 VITALS — BP 118/66 | HR 99 | Ht 64.0 in | Wt 150.0 lb

## 2010-04-22 DIAGNOSIS — G4700 Insomnia, unspecified: Secondary | ICD-10-CM | POA: Insufficient documentation

## 2010-04-22 DIAGNOSIS — E348 Other specified endocrine disorders: Secondary | ICD-10-CM

## 2010-04-22 DIAGNOSIS — R7309 Other abnormal glucose: Secondary | ICD-10-CM

## 2010-04-22 DIAGNOSIS — I10 Essential (primary) hypertension: Secondary | ICD-10-CM

## 2010-04-22 DIAGNOSIS — E785 Hyperlipidemia, unspecified: Secondary | ICD-10-CM

## 2010-04-22 DIAGNOSIS — E8881 Metabolic syndrome: Secondary | ICD-10-CM

## 2010-04-22 MED ORDER — VENLAFAXINE HCL ER 75 MG PO CP24: 75.0000 mg | ORAL_CAPSULE | Freq: Every day | ORAL | Status: DC

## 2010-04-22 MED ORDER — TRAZODONE HCL 50 MG PO TABS: 50.0000 mg | ORAL_TABLET | Freq: Every day | ORAL | Status: DC

## 2010-04-22 NOTE — Assessment & Plan Note (Signed)
We had discussed in the past using a sleep aid. Will try trazodone first before ambien.  Overall she gets a full 8 hours her sleep is just dsirupted. Recommend start with 1/2 tab at bedtime of the trazodone.

## 2010-04-22 NOTE — Progress Notes (Signed)
  Subjective:    Patient ID: Krista Love, female    DOB: [birth year], 55 y.o.   MRN: [id number]  Hypertension This is a chronic problem. The problem has been rapidly improving since onset. Associated symptoms include peripheral edema. Pertinent negatives include no chest pain, palpitations or shortness of breath. There are no associated agents to hypertension. Risk factors for coronary artery disease include no known risk factors. Past treatments include ACE inhibitors. The current treatment provides mild improvement. There are no compliance problems.   Diabetes Pertinent negatives for diabetes include no chest pain.   Feels her mood is good and tried to stop the seroquel but not can't sleep well. She is seeing a Veterinary surgeon.  She would like to wean the effexor. Has used ambien in the past.  Having difficulty staying asleep.  No very tired when she wakes up.   No regular exercise.  Feels she is controlling ehr diet.   Review of Systems  Respiratory: Negative for shortness of breath.   Cardiovascular: Negative for chest pain and palpitations.       Objective:   Physical Exam  Constitutional: She appears well-developed and well-nourished.  HENT:  Head: Normocephalic.  Cardiovascular: Normal rate, regular rhythm and normal heart sounds.        No carotid bruits.   Pulmonary/Chest: Effort normal and breath sounds normal.          Assessment & Plan:

## 2010-04-22 NOTE — Assessment & Plan Note (Signed)
A1C is 6 today.  Will continue to monitor. conintue to monitor diet. Really needs to start regular exercise.

## 2010-04-22 NOTE — Assessment & Plan Note (Addendum)
We inc lisinopril at her last OV. BP is well controlled.  F/U in 6months. Labs UTD in [place].

## 2010-04-22 NOTE — Assessment & Plan Note (Signed)
Reviewed values were at goal in Feb.

## 2010-04-22 NOTE — Patient Instructions (Signed)
Make it a goal to start exercising regularly.  Call me in a month if you are ready to wean down to 37.5mg  on the Effexor and we can send new rx.

## 2010-05-17 ENCOUNTER — Other Ambulatory Visit: Payer: Self-pay | Admitting: Family Medicine

## 2010-05-19 ENCOUNTER — Other Ambulatory Visit: Payer: Self-pay | Admitting: Family Medicine

## 2010-06-02 NOTE — Discharge Summary (Signed)
[name], Krista Love                ACCOUNT NO.:  [id number]   MEDICAL RECORD NO.:  [id number]          PATIENT TYPE:  INP   LOCATION:  4714                         FACILITY:  [facility]   PHYSICIAN:  Madaline Savage, MD        DATE OF BIRTH:  08-Sep-1955   DATE OF ADMISSION:  12/09/2006  DATE OF DISCHARGE:  12/10/2006                               DISCHARGE SUMMARY   PRIMARY CARE PHYSICIAN:  Reuben Likes, M.D.   DISCHARGE DIAGNOSES:  1. Atypical jaw pain, ruled out acute coronary syndrome.  2. Diabetes mellitus.  3. Hyperlipidemia.  4. Hypertension.  5. Family history of coronary artery disease.   DISCHARGE MEDICATIONS:  1. Aspirin 81 mg daily.  2. Lopressor 12.5 mg twice daily.  3. Lipitor 40 mg daily.  4. Lisinopril 10 mg daily.  5. Metformin 500 mg twice daily.  6. Prempro daily.  7. Tylenol as needed.   HISTORY OF PRESENT ILLNESS:  For full history and physical, see the  history and physical dictated by Dr. Tamsen Roers on December 09, 2006.  In  short, [name] is 55 year old lady with a history of hypertension,  diabetes mellitus, hyperlipidemia, and history of smoking who comes in  with atypical jaw pain.  The pain started while she was sitting in front  of the computer.  It lasted approximately 1 hour, and she has not had  any more pain since she had come to the hospital.  She was admitted to  rule out an acute coronary syndrome.   HOSPITAL COURSE:  Problem 1.  Acute jaw pain.  [name] is a 51-year-  old lady with a history of hypertension, diabetes, hyperlipidemia, and  history of smoking who comes in with an acute episode of jaw pain.  She  was admitted to rule out an acute coronary syndrome.  Her EKG did not  show any ST or T wave changes.  She had three sets of troponins which  were negative.  They were 0.02, 0.02, and 0.02.  We also did a lipid  profile in the hospital which was unremarkable.  We have called [facility] to set up an outpatient stress test.   They will arrange for  an outpatient stress test next week.   Problem 2.  Diabetes mellitus.  Her sugars have been controlled the  hospital.  She had a hemoglobin A1c done here which was 6.   Problem 3.  Hyperlipidemia.  She will continue on her home dose of  Zocor.  Her fasting lipid profile done here showed a triglyceride of  122, HDL of 53, LDL of 90, and total cholesterol of 167.   DISPOSITION:  She is now being discharged home in stable condition.  We  have ruled out an acute coronary syndrome.  She will be set up for a  stress test as an outpatient.  I have given her the number of [facility].  [facility] will call her to arrange the stress  test.  I have also asked her to follow up with her primary care doctor,  Dr. Lorenz Coaster, in 1 week.      Madaline Savage, MD  Electronically Signed     PKN/MEDQ  D:  12/10/2006  T:  12/11/2006  Job:  [id number]

## 2010-06-02 NOTE — H&P (Signed)
Krista Love, Krista Love                ACCOUNT NO.:  [id number]   MEDICAL RECORD NO.:  [id number]          PATIENT TYPE:  INP   LOCATION:  4714                         FACILITY:  [facility]   PHYSICIAN:  Beckey Rutter, MD  DATE OF BIRTH:  11-20-1955   DATE OF ADMISSION:  12/09/2006  DATE OF DISCHARGE:                              HISTORY & PHYSICAL   PRIMARY CARE PHYSICIAN:  Dr. Lorenz Coaster.   CHIEF COMPLAINT ON PRESENTATION:  Jaw pain.   HISTORY OF PRESENT ILLNESS:  This is a 55 year old pleasant Caucasian  female with past medical history significant for borderline diabetes,  hypertension, hyperlipidemia, who presented today with a chief complaint  of jaw pain.  The patient stated that the pain started in the left jaw  yesterday intermittently and was associated with sweating.  She then  felt the pain on the right side.  The patient has never experienced such  symptoms.  The pain is aching in nature, moderate in intensity,  associated as discussed with sweating, but no headache, fever, cough or  chest pain.  The patient also denied shortness of breath.  The patient  noticed for the last 2 days a small pustule in her lower lip but  otherwise denied significant symptoms when asked categorically in the  system review.   PAST MEDICAL HISTORY:  1. Borderline hypertension.  2. Borderline diabetes.  The patient is taking metformin now.  3. Hyperlipidemia.  4. Postmenopausal symptoms.  5. Tobacco abuse.   SOCIAL HISTORY:  The patient recently moved from [place] to the  [place] area.  She smokes on a daily basis a few cigarettes only, and  she drinks two to four glasses of wine on a daily basis.  No drug abuse.   FAMILY HISTORY:  Her older brother diagnosed with diabetes.  Her father  died of myocardial infarction/heart disease, and he was diagnosed with  lung cancer before that.   REVIEW OF SYSTEMS:  As per HPI, otherwise not significant.   MEDICATION ALLERGIES:  CODEINE.   MEDICATIONS:  1. Lisinopril.  2. Lipitor.  3. Metformin.  4. Prempro   PHYSICAL EXAMINATION:  VITAL SIGNS:  Temperature 98.1, blood pressure  161/87, pulse 103, respiratory rate is 18.  HEENT:  Head atraumatic, normocephalic.  Eyes:  PERRL.  Mouth moist.  No  ulcer.  Inside the mouth cavity on the lower lip there is a small  papule/pustule.  NECK:  Supple.  No JVD.  PRECORDIUM:  First and second heart sounds audible.  No added sound  appreciated.  LUNGS:  Bilateral fair air entry.  ABDOMEN:  Soft, nontender.  Bowel sounds present.  EXTREMITIES:  No lower extremity edema.  NEUROLOGIC:  Alert, oriented x 3, moving all her extremities  spontaneously.   LABORATORY AND X-RAY:  Her EKG showing normal sinus rhythm with a  ventricular rate of 101.  Her troponin is less than 0.05, CK-MB is less  than 1.0, myoglobin is 40.5.  Sodium 135, potassium 3.6, chloride 101,  bicarb 25, glucose 125, BUN 11, creatinine 0.75.  Second troponin  showing troponin less than 0.05 and  CK-MB less than 1.0.  Her white  blood count 7.8, hemoglobin is 13.0, hematocrit is 37.9, platelet count  is 386.  Chest x-ray is unremarkable.   ASSESSMENT AND PLAN:  This is a 55 year old female who presented today  with jaw pain with a history of heart disease in the family.   PLAN:  1. The patient will be admitted to telemetry floor for further      assessment and management.  2. The patient will be ruled out for acute coronary      syndrome/myocardial infarction by serial cardiac enzymes every 8      hours and serial EKG monitoring.  She will be kept on telemetry as      well.  3. For her diabetes, we will continue the patient on metformin.  We      will check her hemoglobin A1c and we will add a sensitive sliding      scale during her hospitalization.  4. Hyperlipidemia.  We will obtain fasting lipid panel and we will      continue the patient on Lipitor 40 mg tonight.  5. Hypertension.  The patient will be  continued on lisinopril and I      will give the patient one dose of beta blocker now because of the      tachycardia until she rules out.  Her blood pressure now is      slightly elevated and after one dose of beta blocker we will      further assess if we will need to further adjust and continue with      beta blocker.  6. Her tobacco abuse.  Patient counseled and she will be given smoking      cessation material before discharge as well.  7. Daily alcohol use.  The patient stated the wine she is taking is in      moderation and she never experiences any withdrawal symptoms, guilt      or so forth.  8. DVT prophylaxis.  The patient will be kept on Lovenox.  9. GI prophylaxis.  We will start the patient on Protonix.      Beckey Rutter, MD  Electronically Signed     EME/MEDQ  D:  12/09/2006  T:  12/10/2006  Job:  [id number]

## 2010-06-16 ENCOUNTER — Other Ambulatory Visit: Payer: Self-pay | Admitting: Family Medicine

## 2010-07-11 ENCOUNTER — Other Ambulatory Visit: Payer: Self-pay | Admitting: Family Medicine

## 2010-07-13 ENCOUNTER — Other Ambulatory Visit: Payer: Self-pay | Admitting: Family Medicine

## 2010-07-23 ENCOUNTER — Other Ambulatory Visit: Payer: Self-pay | Admitting: Family Medicine

## 2010-07-28 ENCOUNTER — Telehealth: Payer: Self-pay | Admitting: Family Medicine

## 2010-07-28 NOTE — Telephone Encounter (Signed)
Pt is advised of getting the shingles vaccine. She will call ins co 1st to ck on what they will pay for and let us know.

## 2010-07-28 NOTE — Telephone Encounter (Signed)
Call patient: I did receive a note from Dr. Verdell Carmine office about her recurrent shingles outbreak in her eye. She did recommend in the shingles vaccine. We will be happy to do this for her she would like. She can just schedule a nurse visit any time that is convenient for her.

## 2010-08-06 ENCOUNTER — Other Ambulatory Visit: Payer: Self-pay | Admitting: Family Medicine

## 2010-08-28 ENCOUNTER — Other Ambulatory Visit: Payer: Self-pay | Admitting: Family Medicine

## 2010-10-12 ENCOUNTER — Other Ambulatory Visit: Payer: Self-pay | Admitting: Family Medicine

## 2010-10-27 LAB — CARDIAC PANEL(CRET KIN+CKTOT+MB+TROPI)
CK, MB: 0.6
CK, MB: 0.7
Relative Index: INVALID
Total CK: 36
Total CK: 48
Troponin I: 0.02

## 2010-10-27 LAB — CBC
HCT: 35.8 — ABNORMAL LOW
HCT: 37.9
Hemoglobin: 12.3
Hemoglobin: 13
MCHC: 34.4
MCHC: 34.4
MCV: 88.3
Platelets: 386
RBC: 4.07
RDW: 12.9
RDW: 13.1

## 2010-10-27 LAB — BASIC METABOLIC PANEL
BUN: 11
CO2: 25
Chloride: 101
GFR calc non Af Amer: >60
Glucose, Bld: 125 — ABNORMAL HIGH
Potassium: 3.6
Sodium: 135

## 2010-10-27 LAB — DIFFERENTIAL
Basophils Absolute: 0
Basophils Absolute: 0
Basophils Relative: 0
Basophils Relative: 0
Eosinophils Absolute: 0.1 — ABNORMAL LOW
Eosinophils Absolute: 0.1 — ABNORMAL LOW
Eosinophils Relative: 1
Lymphocytes Relative: 37
Monocytes Absolute: 0.5
Neutro Abs: 2.6
Neutrophils Relative %: 45

## 2010-10-27 LAB — CK TOTAL AND CKMB (NOT AT ARMC)
CK, MB: 0.9
Relative Index: INVALID

## 2010-10-27 LAB — COMPREHENSIVE METABOLIC PANEL
ALT: 19
Alkaline Phosphatase: 53
BUN: 14
CO2: 25
GFR calc non Af Amer: >60
Glucose, Bld: 115 — ABNORMAL HIGH
Potassium: 4
Sodium: 137

## 2010-10-27 LAB — POCT CARDIAC MARKERS
CKMB, poc: <1 — ABNORMAL LOW
Operator id: 270111
Troponin i, poc: <0.05
Troponin i, poc: <0.05

## 2010-10-27 LAB — HEMOGLOBIN A1C: Hgb A1c MFr Bld: 6

## 2010-10-27 LAB — APTT: aPTT: 32

## 2010-10-27 LAB — PROTIME-INR
INR: 1
Prothrombin Time: 12.9

## 2010-10-27 LAB — LIPID PANEL: VLDL: 24

## 2010-10-27 LAB — TSH: TSH: 3.833

## 2010-11-08 ENCOUNTER — Other Ambulatory Visit: Payer: Self-pay | Admitting: Family Medicine

## 2010-11-23 ENCOUNTER — Other Ambulatory Visit: Payer: Self-pay | Admitting: Family Medicine

## 2010-12-01 ENCOUNTER — Other Ambulatory Visit: Payer: Self-pay | Admitting: Family Medicine

## 2010-12-14 ENCOUNTER — Other Ambulatory Visit (HOSPITAL_COMMUNITY)
Admission: RE | Admit: 2010-12-14 | Discharge: 2010-12-14 | Disposition: A | Discharge status: Home / Self Care | Payer: 59 | Source: Ambulatory Visit | Attending: Family Medicine | Admitting: Family Medicine

## 2010-12-14 ENCOUNTER — Ambulatory Visit (INDEPENDENT_AMBULATORY_CARE_PROVIDER_SITE_OTHER): Payer: 59 | Admitting: Family Medicine

## 2010-12-14 ENCOUNTER — Encounter: Payer: Self-pay | Admitting: Family Medicine

## 2010-12-14 DIAGNOSIS — Z01419 Encounter for gynecological examination (general) (routine) without abnormal findings: Secondary | ICD-10-CM

## 2010-12-14 DIAGNOSIS — Z8249 Family history of ischemic heart disease and other diseases of the circulatory system: Secondary | ICD-10-CM

## 2010-12-14 DIAGNOSIS — Z1159 Encounter for screening for other viral diseases: Secondary | ICD-10-CM | POA: Insufficient documentation

## 2010-12-14 DIAGNOSIS — E119 Type 2 diabetes mellitus without complications: Secondary | ICD-10-CM

## 2010-12-14 LAB — COMPLETE METABOLIC PANEL WITH GFR
ALT: 28 U/L (ref 0–35)
Alkaline Phosphatase: 95 U/L (ref 39–117)
CO2: 29 mEq/L (ref 19–32)
Creat: 0.73 mg/dL (ref 0.50–1.10)
GFR, Est African American: >89 mL/min
Total Bilirubin: 0.4 mg/dL (ref 0.3–1.2)

## 2010-12-14 LAB — POCT UA - MICROALBUMIN

## 2010-12-14 LAB — LIPID PANEL
HDL: 52 mg/dL (ref >39)
LDL Cholesterol: 83 mg/dL (ref 0–99)
Total CHOL/HDL Ratio: 3.3 Ratio
Triglycerides: 181 mg/dL — ABNORMAL HIGH (ref <150)

## 2010-12-14 NOTE — Progress Notes (Signed)
Addended by: Wyline Beady on: 12/14/2010 09:18 AM   Modules accepted: Orders

## 2010-12-14 NOTE — Progress Notes (Signed)
  Subjective:     Krista Love is a 55 y.o. female and is here for a comprehensive physical exam. The patient reports no problems.  History   Social History  . Marital Status: Married    Spouse Name: N/A    Number of Children: 1  . Years of Education: N/A   Occupational History  . 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 Smoking status: Current Everyday Smoker -- 0.5 packs/day    Types: Cigarettes  . Smokeless tobacco: Not on file  . Alcohol Use: Yes  . Drug Use: No  . Sexually Active: Not Currently   Other Topics Concern  . Not on file   Social History Narrative   No regular exercise. 2 cups of coffee a days.    Health Maintenance  Topic Date Due  . Foot Exam  04/24/1963  . Hemoglobin A1c  10/22/2010  . Ophthalmology Exam  07/27/2011  . Mammogram  09/19/2011  . Influenza Vaccine  10/19/2011  . Urine Microalbumin  12/14/2011  . Colonoscopy  01/18/2013  . Pap Smear  12/13/2013  . Tetanus/tdap  01/19/2016    The following portions of the patient's history were reviewed and updated as appropriate: allergies, current medications, past family history, past medical history, past social history, past surgical history and problem list.  Review of Systems A comprehensive review of systems was negative.   Objective:    BP 143/83  Pulse 84  Wt 143 lb (64.864 kg) General appearance: alert, cooperative and appears stated age Head: Normocephalic, without obvious abnormality, atraumatic Eyes: conj clear, EOMi, PEERLA Ears: normal TM's and external ear canals both ears Nose: Nares normal. Septum midline. Mucosa normal. No drainage or sinus tenderness. Throat: lips, mucosa, and tongue normal; teeth and gums normal Neck: no adenopathy, no carotid bruit, no JVD, supple, symmetrical, trachea midline and thyroid not enlarged, symmetric, no tenderness/mass/nodules Back: symmetric, no curvature. ROM normal. No CVA tenderness. Lungs: clear to auscultation bilaterally Breasts:  normal appearance, no masses or tenderness Heart: regular rate and rhythm, S1, S2 normal, no murmur, click, rub or gallop Abdomen: soft, non-tender; bowel sounds normal; no masses,  no organomegaly Pelvic: cervix normal in appearance, external genitalia normal, no adnexal masses or tenderness, no cervical motion tenderness, rectovaginal septum normal, uterus normal size, shape, and consistency and vagina normal without discharge Extremities: extremities normal, atraumatic, no cyanosis or edema Pulses: 2+ and symmetric Skin: Skin color, texture, turgor normal. No rashes or lesions Lymph nodes: Cervical, supraclavicular, and axillary nodes normal. Neurologic: Grossly normal    Assessment:    Healthy female exam.      Plan:     See After Visit Summary for Counseling Recommendations  Start a regular exercise program and make sure you are eating a healthy diet Try to eat 4 servings of dairy a day or take a calcium supplement (500mg  twice a day). Your vaccines are up to date. Declined flu vaccine.  Given lab slip. With famiily hx of early heart disease will schedule for stress test.

## 2010-12-14 NOTE — Patient Instructions (Signed)
Start a regular exercise program and make sure you are eating a healthy diet Try to eat 4 servings of dairy a day or take a calcium supplement (500mg twice a day). Your vaccines are up to date.   

## 2010-12-18 ENCOUNTER — Other Ambulatory Visit: Payer: Self-pay | Admitting: Family Medicine

## 2010-12-18 DIAGNOSIS — Z8249 Family history of ischemic heart disease and other diseases of the circulatory system: Secondary | ICD-10-CM

## 2010-12-28 ENCOUNTER — Other Ambulatory Visit: Payer: Self-pay | Admitting: Family Medicine

## 2011-01-01 ENCOUNTER — Telehealth: Payer: Self-pay | Admitting: Family Medicine

## 2011-01-01 ENCOUNTER — Ambulatory Visit (INDEPENDENT_AMBULATORY_CARE_PROVIDER_SITE_OTHER): Payer: 59 | Admitting: Physician Assistant

## 2011-01-01 DIAGNOSIS — Z8249 Family history of ischemic heart disease and other diseases of the circulatory system: Secondary | ICD-10-CM

## 2011-01-01 NOTE — Progress Notes (Signed)
Exercise Treadmill Test  Pre-Exercise Testing Evaluation PR:  .15 QRS:  .08  QT:  .37 QTc: .42     Test  Exercise Tolerance Test Ordering MD: Nani Gasser M.D  Interpreting MD:  Tereso Newcomer PA-C  Unique Test No: 1  Treadmill:  1  Indication for ETT: known ASHD  Contraindication to ETT: No   Stress Modality: exercise - treadmill  Cardiac Imaging Performed: non   Protocol: standard Bruce - maximal  Max BP:  199/77  Max MPHR (bpm):  163 85% MPR (bpm):  138  MPHR obtained (bpm):  153 % MPHR obtained:  93%  Reached 85% MPHR (min:sec):  5:38 Total Exercise Time (min-sec):  7:44  Workload in METS:  9.6 Borg Scale: 13  Reason ETT Terminated:  patient's desire to stop    ST Segment Analysis At Rest: normal ST segments - no evidence of significant ST depression With Exercise: no evidence of significant ST depression  Other Information Arrhythmia:  No Angina during ETT:  absent (0) Quality of ETT:  diagnostic  ETT Interpretation:  normal - no evidence of ischemia by ST analysis  Comments: Fair exercise tolerance. No chest pain. Normal BP response to exercise. No ST-T changes to suggest ischemia.   Recommendations: Follow up with Dr. Linford Arnold as directed.

## 2011-01-01 NOTE — Telephone Encounter (Signed)
Pt aware.

## 2011-01-01 NOTE — Telephone Encounter (Signed)
Call pt: Stress test is normal.

## 2011-01-04 ENCOUNTER — Encounter: Payer: 59 | Admitting: Physician Assistant

## 2011-01-23 ENCOUNTER — Other Ambulatory Visit: Payer: Self-pay | Admitting: Family Medicine

## 2011-02-03 ENCOUNTER — Other Ambulatory Visit: Payer: Self-pay | Admitting: Family Medicine

## 2011-02-24 ENCOUNTER — Other Ambulatory Visit: Payer: Self-pay | Admitting: Family Medicine

## 2011-03-12 ENCOUNTER — Encounter: Payer: Self-pay | Admitting: *Deleted

## 2011-03-16 ENCOUNTER — Ambulatory Visit: Payer: 59 | Admitting: Family Medicine

## 2011-03-18 ENCOUNTER — Encounter: Payer: Self-pay | Admitting: Family Medicine

## 2011-03-18 ENCOUNTER — Ambulatory Visit (INDEPENDENT_AMBULATORY_CARE_PROVIDER_SITE_OTHER): Payer: 59 | Admitting: Family Medicine

## 2011-03-18 VITALS — BP 129/79 | HR 88 | Ht 63.0 in | Wt 148.0 lb

## 2011-03-18 DIAGNOSIS — I10 Essential (primary) hypertension: Secondary | ICD-10-CM

## 2011-03-18 DIAGNOSIS — E119 Type 2 diabetes mellitus without complications: Secondary | ICD-10-CM

## 2011-03-18 DIAGNOSIS — E785 Hyperlipidemia, unspecified: Secondary | ICD-10-CM

## 2011-03-18 DIAGNOSIS — G5600 Carpal tunnel syndrome, unspecified upper limb: Secondary | ICD-10-CM

## 2011-03-18 MED ORDER — QUETIAPINE FUMARATE 50 MG PO TABS: 50.0000 mg | ORAL_TABLET | Freq: Every day | ORAL | Status: DC

## 2011-03-18 MED ORDER — LISINOPRIL 20 MG PO TABS: 20.0000 mg | ORAL_TABLET | Freq: Every day | ORAL | Status: DC

## 2011-03-18 MED ORDER — ATORVASTATIN CALCIUM 80 MG PO TABS: 80.0000 mg | ORAL_TABLET | Freq: Every day | ORAL | Status: DC

## 2011-03-18 MED ORDER — VENLAFAXINE HCL ER 75 MG PO CP24: 75.0000 mg | ORAL_CAPSULE | Freq: Every day | ORAL | Status: DC

## 2011-03-18 MED ORDER — METFORMIN HCL ER 500 MG PO TB24: 500.0000 mg | ORAL_TABLET | Freq: Every day | ORAL | Status: DC

## 2011-03-18 NOTE — Progress Notes (Signed)
Subjective:    Patient ID: Krista Love, female    DOB: [birth year], 56 y.o.   MRN: [id number]  Hypertension This is a chronic problem. The current episode started more than 1 year ago. The problem is unchanged. The problem is controlled. Pertinent negatives include no chest pain or shortness of breath. There are no associated agents to hypertension. Risk factors for coronary artery disease include no known risk factors. Past treatments include ACE inhibitors. The current treatment provides no improvement. Compliance problems include exercise.   Hyperlipidemia This is a chronic problem. The current episode started more than 1 year ago. The problem is uncontrolled. Pertinent negatives include no chest pain or shortness of breath. Current antihyperlipidemic treatment includes statins. The current treatment provides moderate improvement of lipids. There are no compliance problems.   Diabetes She presents for her follow-up diabetic visit. She has type 2 diabetes mellitus. Her disease course has been stable. There are no hypoglycemic associated symptoms. Pertinent negatives for diabetes include no chest pain. There are no hypoglycemic complications. Symptoms are stable. She is compliant with treatment all of the time. She has not had a previous visit with a dietician. She rarely participates in exercise. There is no change in her home blood glucose trend. (Not checking her sugars.  ) An ACE inhibitor/angiotensin II receptor blocker is being taken.    Tingling in the right hand and arm.  Occ both arm.But the right is worse. Will wake her up, no matter what position she sleeps in.  [facility] says has been going on for 2-3 year. Feels better when she brings her arm doswn. Happens now when doing her hair. The numbness usually affects four fingers but not respond.She denies any neck pain.   Review of Systems  Respiratory: Negative for shortness of breath.   Cardiovascular: Negative for chest pain.         Objective:   Physical Exam  Constitutional: She is oriented to person, place, and time. She appears well-developed and well-nourished.  HENT:  Head: Normocephalic and atraumatic.  Cardiovascular: Normal rate, regular rhythm and normal heart sounds.   Pulmonary/Chest: Effort normal and breath sounds normal.  Musculoskeletal:       Neck with normal flexion, extension, rotation right and left, side bending. She is nontender of the cervical spine. She is normal range of motion of her shoulders. She is nontender and no swelling over her respiratory. She had negative Tinel's but positive Phalen's sign in both hands.  Neurological: She is alert and oriented to person, place, and time.  Skin: Skin is warm and dry.  Psychiatric: She has a normal mood and affect. Her behavior is normal.          Assessment & Plan:  DM- her A1c looks fantastic today. She still had great job. Keep up the good work. Continue current regimen followup in 4 months. Lab Results  Component Value Date   HGBA1C 6.1 03/18/2011   HTN-blood pressure well-controlled looks fantastic today.  Hyperlpidemai- tolerating her Lipitor well. No problems palpitations. Continue current regimen.  Carpal tunnel syndrome, bilateral-based on her history and positive Phalen's sign I think she does have carpal tunnel syndrome. We discussed wearing night splints for the next 3-4 weeks to see if this resolves her symptoms will improve some. If at that point she has no significant improvement and she is to call the office back and let me know. Consider that this could also be coming from her neck. She has a normal neck exam  today. She can certainly use Aleve or ibuprofen if needed which is not really having any discomfort. Also consider injection if she is not improving. H.O given.

## 2011-03-18 NOTE — Patient Instructions (Signed)
Carpal Tunnel Syndrome The carpal tunnel is a narrow hollow area in the wrist. It is formed by the wrist bones and ligaments. Nerves, blood vessels, and tendons (cord like structures which attach muscle to bone) on the palm side (the side of your hand in the direction your fingers bend) of your hand pass through the carpal tunnel. Repeated wrist motion or certain diseases may cause swelling within the tunnel. (That is why these are called repetitive trauma (damage caused by over use) disorders. It is also a common problem in late pregnancy.) This swelling pinches the main nerve in the wrist (median nerve) and causes the painful condition called carpal tunnel syndrome. A feeling of "pins and needles" may be noticed in the fingers or hand; however, the entire arm may ache from this condition. Carpal tunnel syndrome may clear up by itself. Cortisone injections may help. Sometimes, an operation may be needed to free the pinched nerve. An electromyogram (a type of test) may be needed to confirm this diagnosis (learning what is wrong). This is a test which measures nerve conduction. The nerve conduction is usually slowed in a carpal tunnel syndrome. HOME CARE INSTRUCTIONS   If your caregiver prescribed medication to help reduce swelling, take as directed.   If you were given a splint to keep your wrist from bending, use it as instructed. It is important to wear the splint at night. Use the splint for as long as you have pain or numbness in your hand, arm or wrist. This may take 1 to 2 months.   If you have pain at night, it may help to rub or shake your hand, or elevate your hand above the level of your heart (the center of your chest).   It is important to give your wrist a rest by stopping the activities that are causing the problem. If your symptoms (problems) are work-related, you may need to talk to your employer about changing to a job that does not require using your wrist.   Only take over-the-counter  or prescription medicines for pain, discomfort, or fever as directed by your caregiver.   Following periods of extended use, particularly strenuous use, apply an ice pack wrapped in a towel to the anterior (palm) side of the affected wrist for 20 to 30 minutes. Repeat as needed three to four times per day. This will help reduce the swelling.   Follow all instructions for follow-up with your caregiver. This includes any orthopedic referrals, physical therapy, and rehabilitation. Any delay in obtaining necessary care could result in a delay or failure of your condition to heal.  SEEK IMMEDIATE MEDICAL CARE IF:   You are still having pain and numbness following a week of treatment.   You develop new, unexplained symptoms.   Your current symptoms are getting worse and are not helped or controlled with medications.  MAKE SURE YOU:   Understand these instructions.   Will watch your condition.   Will get help right away if you are not doing well or get worse.  Document Released: 01/02/2000 Document Revised: 09/16/2010 Document Reviewed: 08/09/2007 Exit[employer] Patient Information 2012 [employer]. 

## 2011-04-30 ENCOUNTER — Other Ambulatory Visit: Payer: Self-pay | Admitting: Family Medicine

## 2011-06-01 ENCOUNTER — Other Ambulatory Visit (INDEPENDENT_AMBULATORY_CARE_PROVIDER_SITE_OTHER): Payer: 59 | Admitting: Family Medicine

## 2011-06-01 DIAGNOSIS — E119 Type 2 diabetes mellitus without complications: Secondary | ICD-10-CM

## 2011-06-01 LAB — POCT GLYCOSYLATED HEMOGLOBIN (HGB A1C): Hemoglobin A1C: 6.3

## 2011-06-01 NOTE — Progress Notes (Signed)
Here for A1C check

## 2011-06-01 NOTE — Progress Notes (Signed)
  Subjective:    Patient ID: Krista Love, female    DOB: [birth year], 56 y.o.   MRN: [id number]  HPI    Review of Systems     Objective:   Physical Exam        Assessment & Plan:  DM- A1c still looks well controlled. I'm not sure why she was a nurse visit for diabetic followup. Have her followup with me in 3 months. Continue current regimen. Cipriano Bunker, MD

## 2011-06-10 ENCOUNTER — Ambulatory Visit (INDEPENDENT_AMBULATORY_CARE_PROVIDER_SITE_OTHER): Payer: 59 | Admitting: Family Medicine

## 2011-06-10 ENCOUNTER — Encounter: Payer: Self-pay | Admitting: Family Medicine

## 2011-06-10 VITALS — BP 140/80 | HR 91 | Ht 63.0 in | Wt 152.0 lb

## 2011-06-10 DIAGNOSIS — I10 Essential (primary) hypertension: Secondary | ICD-10-CM

## 2011-06-10 DIAGNOSIS — G5600 Carpal tunnel syndrome, unspecified upper limb: Secondary | ICD-10-CM

## 2011-06-10 NOTE — Progress Notes (Signed)
  Subjective:    Patient ID: EWA HIPP, female    DOB: 05-Aug-1955, 56 y.o.   MRN: [id number]  HPI Says the brace for her carpal tunnel helped it about 99%.  Says the numbness has stopped.  Says in the morning when drying her hair will get some numbness just a little.   HTN - Takes lisinopril at [facility].  No CP or SOB. Has been taking meds consistnatly.   BP Readings from Last 3 Encounters:  06/10/11 140/80  06/01/11 136/77  03/18/11 129/79    Review of Systems     Objective:   Physical Exam  Constitutional: She is oriented to person, place, and time. She appears well-developed and well-nourished.  HENT:  Head: Normocephalic and atraumatic.  Neurological: She is alert and oriented to person, place, and time.  Skin: Skin is warm and dry.  Psychiatric: She has a normal mood and affect. Her behavior is normal.          Assessment & Plan:  CArpel Tunnel - discussed options. This point time she can quit wearing the brace. She's been wearing it for the last 3 months. I encouraged her to give it a little bit of time to see how well she does without it. She can certainly keep it or if after a few weeks or months it starts to bother her again she can start back on for one to 2 weeks at a time to get her symptoms back under control. We also discussed making sure she has correct positioning of her wrists while she is typing. 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 He also consider injections if she feels like she is wearing the brace frequently. I discussed that surgery would be a last resort at this point.  Hypertension-uncontrolled today. Her blood pressure looks great back in February not sure what the differences. She will check it at home for the next 2 weeks. If she's getting blood pressures at 140 or above she is to call the office and we'll increase her lisinopril. Otherwise if they look great and she can keep her follow up in August.

## 2011-06-18 ENCOUNTER — Other Ambulatory Visit: Payer: Self-pay | Admitting: Family Medicine

## 2011-06-18 ENCOUNTER — Telehealth: Payer: Self-pay | Admitting: *Deleted

## 2011-06-18 MED ORDER — CONJ ESTROG-MEDROXYPROGEST ACE 0.625-2.5 MG PO TABS: 1.0000 | ORAL_TABLET | Freq: Every day | ORAL | Status: DC

## 2011-06-18 NOTE — Telephone Encounter (Signed)
Pt is requesting refill on Prempro but it doesn't look as if we were the ones who prescribed it last. Please advise if I can fill.

## 2011-06-18 NOTE — Telephone Encounter (Signed)
Sent refill. OK.

## 2011-07-07 ENCOUNTER — Telehealth: Payer: Self-pay | Admitting: *Deleted

## 2011-07-07 DIAGNOSIS — G5600 Carpal tunnel syndrome, unspecified upper limb: Secondary | ICD-10-CM

## 2011-07-07 NOTE — Telephone Encounter (Signed)
Pt is requesting a referral to get steroid injections for her carpal tunnel.

## 2011-07-07 NOTE — Telephone Encounter (Signed)
Referral placed.

## 2011-07-08 NOTE — Telephone Encounter (Signed)
Pt informed

## 2011-08-16 ENCOUNTER — Ambulatory Visit (INDEPENDENT_AMBULATORY_CARE_PROVIDER_SITE_OTHER): Payer: 59 | Admitting: Physician Assistant

## 2011-08-16 ENCOUNTER — Encounter: Payer: Self-pay | Admitting: Physician Assistant

## 2011-08-16 VITALS — BP 131/69 | HR 110 | Temp 98.3°F | Ht 63.0 in | Wt 151.0 lb

## 2011-08-16 DIAGNOSIS — Z1239 Encounter for other screening for malignant neoplasm of breast: Secondary | ICD-10-CM

## 2011-08-16 DIAGNOSIS — H6691 Otitis media, unspecified, right ear: Secondary | ICD-10-CM

## 2011-08-16 DIAGNOSIS — H6690 Otitis media, unspecified, unspecified ear: Secondary | ICD-10-CM

## 2011-08-16 MED ORDER — CIPROFLOXACIN-DEXAMETHASONE 0.3-0.1 % OT SUSP: 4.0000 [drp] | Freq: Two times a day (BID) | OTIC | Status: AC

## 2011-08-16 NOTE — Progress Notes (Signed)
  Subjective:    Patient ID: Krista Love, female    DOB: Jun 15, 1955, 56 y.o.   MRN: [id number]  HPI Patient present to clinic with right ear pressure and pain for 2 days. She feels like she needs to pop her ear but it will not pop. She denies any ear drainage. Denies fever, chills, cough, or SOB. She has had pressure on left cheek and has a hx of sinus infections. She did go to [employer] [place] 2 weeks ago but did not go under water. She has not tried anything to make better. The pain and popping is constant. She feels like she has had some hearing loss.     Review of Systems     Objective:   Physical Exam  Constitutional: She is oriented to person, place, and time. She appears well-developed and well-nourished.  HENT:  Head: Normocephalic and atraumatic.  Right Ear: External ear normal.  Left Ear: External ear normal.  Mouth/Throat: Oropharynx is clear and moist. No oropharyngeal exudate.       No tenderness to palpation of tragus.Right TM dull and erythematous not able to view ossicles. No blood or pus. External canal very erythematous. Bilateral turbinates are swollen and red.   Eyes: Conjunctivae are normal.  Neck: Normal range of motion. Neck supple.       Right side anterior cervical lymphnodes enlarged but not tender.  Cardiovascular: Normal rate, regular rhythm and normal heart sounds.   Pulmonary/Chest: Effort normal and breath sounds normal. She has no wheezes.  Neurological: She is alert and oriented to person, place, and time.  Skin: Skin is warm and dry.  Psychiatric: She has a normal mood and affect. Her behavior is normal.          Assessment & Plan:  Otitis media, right- Gave ciprodex drops 4 drops BID for 7 days. Encouraged patient to get sudafed and use for next 2 - 3 days to relieve some of the pressure. Call if not improving.  Will refer for mammogram.

## 2011-08-16 NOTE — Patient Instructions (Addendum)
Sudafed to help relieve congestion and start Ciprodex 4 drops twice a day. Call if not improving.  Will refer for mammogram.

## 2011-08-30 ENCOUNTER — Other Ambulatory Visit: Payer: Self-pay | Admitting: Family Medicine

## 2011-09-21 ENCOUNTER — Ambulatory Visit (INDEPENDENT_AMBULATORY_CARE_PROVIDER_SITE_OTHER): Payer: 59

## 2011-09-21 DIAGNOSIS — Z1239 Encounter for other screening for malignant neoplasm of breast: Secondary | ICD-10-CM

## 2011-09-21 DIAGNOSIS — Z1231 Encounter for screening mammogram for malignant neoplasm of breast: Secondary | ICD-10-CM

## 2011-10-03 ENCOUNTER — Other Ambulatory Visit: Payer: Self-pay | Admitting: Family Medicine

## 2011-10-28 ENCOUNTER — Ambulatory Visit: Payer: 59 | Admitting: Family Medicine

## 2011-11-03 ENCOUNTER — Ambulatory Visit (INDEPENDENT_AMBULATORY_CARE_PROVIDER_SITE_OTHER): Payer: 59 | Admitting: Family Medicine

## 2011-11-03 ENCOUNTER — Encounter: Payer: Self-pay | Admitting: Family Medicine

## 2011-11-03 VITALS — BP 128/65 | HR 90 | Wt 151.0 lb

## 2011-11-03 DIAGNOSIS — E8881 Metabolic syndrome: Secondary | ICD-10-CM

## 2011-11-03 DIAGNOSIS — I10 Essential (primary) hypertension: Secondary | ICD-10-CM

## 2011-11-03 DIAGNOSIS — E785 Hyperlipidemia, unspecified: Secondary | ICD-10-CM

## 2011-11-03 LAB — LIPID PANEL
LDL Cholesterol: 122 mg/dL — ABNORMAL HIGH (ref 0–99)
Total CHOL/HDL Ratio: 3.8 Ratio
VLDL: 45 mg/dL — ABNORMAL HIGH (ref 0–40)

## 2011-11-03 LAB — COMPLETE METABOLIC PANEL WITH GFR
ALT: 24 U/L (ref 0–35)
AST: 18 U/L (ref 0–37)
Albumin: 4.6 g/dL (ref 3.5–5.2)
BUN: 12 mg/dL (ref 6–23)
Calcium: 9.5 mg/dL (ref 8.4–10.5)
Chloride: 104 mEq/L (ref 96–112)
Potassium: 4.6 mEq/L (ref 3.5–5.3)
Sodium: 142 mEq/L (ref 135–145)
Total Protein: 6.6 g/dL (ref 6.0–8.3)

## 2011-11-03 LAB — POCT GLYCOSYLATED HEMOGLOBIN (HGB A1C): Hemoglobin A1C: 6

## 2011-11-03 LAB — POCT UA - MICROALBUMIN: Albumin/Creatinine Ratio, Urine, POC: <30

## 2011-11-03 NOTE — Progress Notes (Signed)
  Subjective:    Patient ID: Krista Love, female    DOB: Sep 30, 1955, 56 y.o.   MRN: [id number]  HPI HTN  - no chest pain or shortness of breath. She's taking her medications regularly without any problems or side effects. No palpitations or dizziness. Blood pressures have been well controlled.  Hyperlipidemia - tolerating her statin well without any myalgias or side effects. She says she would eventually like to be able to wean down her dose.  IFG- doing well. No hypoglycemic events. She's taking her metformin regularly without any problems or side effects.   Review of Systems     Objective:   Physical Exam  Constitutional: She is oriented to person, place, and time. She appears well-developed and well-nourished.  HENT:  Head: Normocephalic and atraumatic.  Cardiovascular: Normal rate, regular rhythm and normal heart sounds.   Pulmonary/Chest: Effort normal and breath sounds normal.  Neurological: She is alert and oriented to person, place, and time.  Skin: Skin is warm and dry.  Psychiatric: She has a normal mood and affect. Her behavior is normal.          Assessment & Plan:  HTN -  Well controlled. Continue current regimen. Followup in 6 months.  Hyperlipidemia-doing very well on her statin we'll recheck her lipids today. If still has a great LDL consider dropping her Lipitor down to 40 mg to see if she can still return goal on a lower dose which would potentially reduce her future risk for myalgias and side effects.  Impaired fasting glucose-her A1c looks great today. If the best it's been in the last 2 years at 6.0. Continue with the metformin for now. Urine micron been is negative for protein. Monofilament exam performed today.  Declines flu vaccine today.

## 2011-11-04 ENCOUNTER — Encounter: Payer: 59 | Admitting: Sports Medicine

## 2011-11-05 ENCOUNTER — Other Ambulatory Visit: Payer: Self-pay | Admitting: Family Medicine

## 2011-11-05 MED ORDER — ROSUVASTATIN CALCIUM 40 MG PO TABS: 40.0000 mg | ORAL_TABLET | Freq: Every day | ORAL | Status: DC

## 2011-11-05 NOTE — Telephone Encounter (Signed)
See lab note- pt is taking a full tab of this everyday and didn't want to refill if you are gonna change it

## 2011-11-20 ENCOUNTER — Other Ambulatory Visit: Payer: Self-pay | Admitting: Family Medicine

## 2011-12-23 ENCOUNTER — Other Ambulatory Visit: Payer: Self-pay | Admitting: Family Medicine

## 2011-12-28 ENCOUNTER — Other Ambulatory Visit: Payer: Self-pay | Admitting: Family Medicine

## 2011-12-31 ENCOUNTER — Other Ambulatory Visit: Payer: Self-pay | Admitting: *Deleted

## 2011-12-31 MED ORDER — METFORMIN HCL ER 500 MG PO TB24: 500.0000 mg | ORAL_TABLET | Freq: Every day | ORAL | Status: DC

## 2011-12-31 MED ORDER — VENLAFAXINE HCL ER 75 MG PO CP24: 75.0000 mg | ORAL_CAPSULE | Freq: Every day | ORAL | Status: DC

## 2012-02-06 ENCOUNTER — Other Ambulatory Visit: Payer: Self-pay | Admitting: Family Medicine

## 2012-02-15 ENCOUNTER — Other Ambulatory Visit: Payer: Self-pay | Admitting: Family Medicine

## 2012-02-25 ENCOUNTER — Other Ambulatory Visit: Payer: Self-pay | Admitting: Family Medicine

## 2012-03-09 ENCOUNTER — Encounter: Payer: Self-pay | Admitting: Family Medicine

## 2012-03-09 ENCOUNTER — Ambulatory Visit (INDEPENDENT_AMBULATORY_CARE_PROVIDER_SITE_OTHER): Payer: 59 | Admitting: Family Medicine

## 2012-03-09 VITALS — BP 150/89 | HR 100 | Ht 63.0 in | Wt 150.0 lb

## 2012-03-09 DIAGNOSIS — R1013 Epigastric pain: Secondary | ICD-10-CM

## 2012-03-09 DIAGNOSIS — E785 Hyperlipidemia, unspecified: Secondary | ICD-10-CM

## 2012-03-09 DIAGNOSIS — E119 Type 2 diabetes mellitus without complications: Secondary | ICD-10-CM

## 2012-03-09 MED ORDER — ROSUVASTATIN CALCIUM 40 MG PO TABS: 40.0000 mg | ORAL_TABLET | Freq: Every day | ORAL | Status: DC

## 2012-03-09 NOTE — Progress Notes (Signed)
Subjective:    Patient ID: Krista Love, female    DOB: 1955-03-22, 57 y.o.   MRN: [id number]  HPI Made some homemade vegetable soup and about 2 hours later got a sharp burning cramping sensation in her RUQ nad LUQ.  Then vomited twice.  Today not vomiting but still feels some more mild burning. Feels sore today. Was able to eat chicken biscuit.  Some loose stools earlier in the week but not in the last 2 days. No fever. + fatigue. Has had some water today.   Hyperlipidemia-tolerating her statin well without any side effects. She does need refills today requesting 90 day supply.   Review of Systems BP 150/89  Pulse 100  Ht 5\' 3"  (1.6 m)  Wt 150 lb (68.04 kg)  BMI 26.58 kg/m2    Allergies  Allergen Reactions  . Codeine     Past Medical History  Diagnosis Date  . Hypertension   . Hyperlipidemia   . Diabetes mellitus     Past Surgical History  Procedure Laterality Date  . Back surgery for a ruptured disc      History   Social History  . Marital Status: Married    Spouse Name: N/A    Number of Children: 1  . Years of Education: N/A   Occupational History  . 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 Smoking status: Current Every Day Smoker -- 0.50 packs/day    Types: Cigarettes  . Smokeless tobacco: Not on file  . Alcohol Use: Yes  . Drug Use: No  . Sexually Active: Not Currently   Other Topics Concern  . Not on file   Social History Narrative   No regular exercise. 2 cups of coffee a days.     Family History  Problem Relation Age of Onset  . Hypertension Mother   . Diabetes Brother   . Cancer Maternal Grandfather     skin  . Stomach cancer Sister   . Heart disease Sister 60    stents.     Outpatient Encounter Prescriptions as of 03/09/2012  Medication Sig Dispense Refill  . lisinopril (PRINIVIL,ZESTRIL) 20 MG tablet TAKE 1 TABLET (20 MG TOTAL) BY MOUTH DAILY.  90 tablet  0  . metFORMIN (GLUCOPHAGE-XR) 500 MG 24 hr tablet Take 1 tablet  (500 mg total) by mouth daily with breakfast.  90 tablet  1  . ONETOUCH DELICA LANCETS MISC by Does not apply route. Delica Lancets and test strips- DX: 250.00 test once daily       . PREMPRO 0.625-2.5 MG per tablet TAKE 1 TABLET BY MOUTH DAILY.  28 tablet  3  . QUEtiapine (SEROQUEL) 50 MG tablet TAKE 1 TABLET BY MOUTH ONCE A DAY  30 tablet  2  . rosuvastatin (CRESTOR) 40 MG tablet Take 1 tablet (40 mg total) by mouth daily.  90 tablet  1  . venlafaxine XR (EFFEXOR-XR) 75 MG 24 hr capsule Take 1 capsule (75 mg total) by mouth daily.  90 capsule  1  . [DISCONTINUED] rosuvastatin (CRESTOR) 40 MG tablet Take 1 tablet (40 mg total) by mouth daily.  30 tablet  5  . [DISCONTINUED] metFORMIN (GLUCOPHAGE-XR) 500 MG 24 hr tablet TAKE 1 TABLET BY MOUTH ONCE A DAY  90 tablet  1  . [DISCONTINUED] metFORMIN (GLUCOPHAGE-XR) 500 MG 24 hr tablet TAKE 1 TABLET BY MOUTH ONCE A DAY  90 tablet  1  . [DISCONTINUED] QUEtiapine (SEROQUEL) 50 MG tablet Take 1  tablet (50 mg total) by mouth at bedtime.  90 tablet  1   No facility-administered encounter medications on file as of 03/09/2012.          Objective:   Physical Exam  Constitutional: She is oriented to person, place, and time. She appears well-developed and well-nourished.  HENT:  Head: Normocephalic and atraumatic.  Mouth/Throat: Oropharynx is clear and moist.  Eyes: Conjunctivae are normal. Pupils are equal, round, and reactive to light.  Neck: Neck supple. No thyromegaly present.  Cardiovascular: Normal rate, regular rhythm and normal heart sounds.   Pulmonary/Chest: Effort normal and breath sounds normal.  Abdominal: Soft. Bowel sounds are normal. She exhibits no distension and no mass. There is tenderness. There is no rebound and no guarding.  Tender in the epigastri area, RUQ and RLQ  Lymphadenopathy:    She has no cervical adenopathy.  Neurological: She is alert and oriented to person, place, and time.  Skin: Skin is warm and dry.  Psychiatric:  She has a normal mood and affect. Her behavior is normal.          Assessment & Plan:  Epigastric pain - unclear etiology. Could be food poisoning versus just a viral illness. Topic she's actually feeling better today and has been able to keep down fluids. I offered a nausea medication she declined at this time. We will check a CBC as well as liver and pancreatic enzymes just make sure there is nothing else going on since she is tender in the right upper quadrant. But I suspect that these will be normal and that she will continue to improve in the next 24-48 hours. I did encourage her to get H2 blocker such as Zantac or Pepcid over-the-counter and take this for the next 2 weeks. I think it will help her symptoms resolve more quickly get her back to eating more normally. Also gave her handout on reflux that have some suggestions on foods to avoid when her stomach is Re: irritated. Call if having recurrence of symptoms or not completely resolving.  Hyperlipidemia-90 day prescription given. She is due to recheck level since we adjusted her dose recently. Lab slip given today.  Followup for diabetic checkup later this month. She is overdue for her check.

## 2012-03-09 NOTE — Patient Instructions (Signed)
Recommend pepcid  Or zantac twice a day for 2 weeks  Diet for Gastroesophageal Reflux Disease, Adult Reflux (acid reflux) is when acid from your stomach flows up into the esophagus. When acid comes in contact with the esophagus, the acid causes irritation and soreness (inflammation) in the esophagus. When reflux happens often or so severely that it causes damage to the esophagus, it is called gastroesophageal reflux disease (GERD). Nutrition therapy can help ease the discomfort of GERD. FOODS OR DRINKS TO AVOID OR LIMIT  Smoking or chewing tobacco. Nicotine is one of the most potent stimulants to acid production in the gastrointestinal tract.  Caffeinated and decaffeinated coffee and black tea.  Regular or low-calorie carbonated beverages or energy drinks (caffeine-free carbonated beverages are allowed).   Strong spices, such as black pepper, white pepper, red pepper, cayenne, curry powder, and chili powder.  Peppermint or spearmint.  Chocolate.  High-fat foods, including meats and fried foods. Extra added fats including oils, butter, salad dressings, and nuts. Limit these to less than 8 tsp per day.  Fruits and vegetables if they are not tolerated, such as citrus fruits or tomatoes.  Alcohol.  Any food that seems to aggravate your condition. If you have questions regarding your diet, call your caregiver or a registered dietitian. OTHER THINGS THAT MAY HELP GERD INCLUDE:   Eating your meals slowly, in a relaxed setting.  Eating 5 to 6 small meals per day instead of 3 large meals.  Eliminating food for a period of time if it causes distress.  Not lying down until 3 hours after eating a meal.  Keeping the head of your bed raised 6 to 9 inches (15 to 23 cm) by using a foam wedge or blocks under the legs of the bed. Lying flat may make symptoms worse.  Being physically active. Weight loss may be helpful in reducing reflux in overweight or obese adults.  Wear loose fitting  clothing EXAMPLE MEAL PLAN This meal plan is approximately 2,000 calories based on https://www.bernard.org/ meal planning guidelines. Breakfast   cup cooked oatmeal.  1 cup strawberries.  1 cup low-fat milk.  1 oz almonds. Snack  1 cup cucumber slices.  6 oz yogurt (made from low-fat or fat-free milk). Lunch  2 slice whole-wheat bread.  2 oz sliced [place].  2 tsp mayonnaise.  1 cup blueberries.  1 cup snap peas. Snack  6 whole-wheat crackers.  1 oz string cheese. Dinner   cup brown rice.  1 cup mixed veggies.  1 tsp olive oil.  3 oz grilled fish. Document Released: 01/04/2005 Document Revised: 03/29/2011 Document Reviewed: 11/20/2010 [facility] Patient Information 2013 [place], [place].

## 2012-03-10 LAB — CBC WITH DIFFERENTIAL/PLATELET
Basophils Relative: 0 % (ref 0–1)
Eosinophils Absolute: 0.2 10*3/uL (ref 0.0–0.7)
HCT: 39.5 % (ref 36.0–46.0)
Hemoglobin: 13.5 g/dL (ref 12.0–15.0)
MCH: 30.1 pg (ref 26.0–34.0)
MCHC: 34.2 g/dL (ref 30.0–36.0)
Monocytes Absolute: 0.7 10*3/uL (ref 0.1–1.0)
Monocytes Relative: 9 % (ref 3–12)
Neutrophils Relative %: 57 % (ref 43–77)
RDW: 13.3 % (ref 11.5–15.5)

## 2012-03-10 LAB — COMPLETE METABOLIC PANEL WITH GFR
BUN: 10 mg/dL (ref 6–23)
CO2: 31 mEq/L (ref 19–32)
Calcium: 9.8 mg/dL (ref 8.4–10.5)
Chloride: 102 mEq/L (ref 96–112)
Creat: 0.77 mg/dL (ref 0.50–1.10)
GFR, Est African American: >89 mL/min
GFR, Est Non African American: 85 mL/min

## 2012-03-14 ENCOUNTER — Other Ambulatory Visit: Payer: Self-pay | Admitting: Family Medicine

## 2012-03-17 LAB — LIPID PANEL
Cholesterol: 154 mg/dL (ref 0–200)
Triglycerides: 105 mg/dL (ref <150)

## 2012-03-23 ENCOUNTER — Ambulatory Visit (INDEPENDENT_AMBULATORY_CARE_PROVIDER_SITE_OTHER): Payer: 59 | Admitting: Family Medicine

## 2012-03-23 ENCOUNTER — Encounter: Payer: Self-pay | Admitting: Family Medicine

## 2012-03-23 VITALS — BP 129/69 | HR 112 | Wt 149.0 lb

## 2012-03-23 DIAGNOSIS — K2970 Gastritis, unspecified, without bleeding: Secondary | ICD-10-CM

## 2012-03-23 DIAGNOSIS — K2990 Gastroduodenitis, unspecified, without bleeding: Secondary | ICD-10-CM

## 2012-03-23 DIAGNOSIS — R7301 Impaired fasting glucose: Secondary | ICD-10-CM

## 2012-03-23 DIAGNOSIS — E785 Hyperlipidemia, unspecified: Secondary | ICD-10-CM

## 2012-03-23 DIAGNOSIS — I10 Essential (primary) hypertension: Secondary | ICD-10-CM

## 2012-03-23 NOTE — Progress Notes (Signed)
  Subjective:    Patient ID: Krista Love, female    DOB: 05-18-1955, 57 y.o.   MRN: [id number]  HPI Had gastritis about 2.5 weeks ago and has had some burning nin the epigastrum and nausea daliy since then. She is now zantac and her sxs have resolved 4 days ago. No more nuasea or vomiting  IFG - Here to go over her results. No regular exercise.    HTN-  Pt denies chest pain, SOB, dizziness, or heart palpitations.  Taking meds as directed w/o problems.  Denies medication side effects.     Review of Systems     Objective:   Physical Exam  Constitutional: She is oriented to person, place, and time. She appears well-developed and well-nourished.  HENT:  Head: Normocephalic and atraumatic.  Cardiovascular: Normal rate, regular rhythm and normal heart sounds.   Pulmonary/Chest: Effort normal and breath sounds normal.  Neurological: She is alert and oriented to person, place, and time.  Skin: Skin is warm and dry.  Psychiatric: She has a normal mood and affect. Her behavior is normal.          Assessment & Plan:  Gastritis.discussed most likely gastritis s/p gastroenteritis.   Continue PPI for one more week. If still better then can D/C  IFG-Work on diet and exercise. We discussed the fact that she is him is diabetic at this point. We'll need to get this back under control. She feels like overall she does a good job with her diet but she's not getting any regular exercise and encouraged her to focus on that. Recheck in 4 months.    HTN- Well conrollled.  Much improved from last visit.  Hyperlipidemia - Chol looks great today. Continue Crestor.  Declined pneumonia vaccine again.

## 2012-05-16 ENCOUNTER — Other Ambulatory Visit: Payer: Self-pay | Admitting: Family Medicine

## 2012-05-29 ENCOUNTER — Other Ambulatory Visit: Payer: Self-pay | Admitting: Family Medicine

## 2012-07-25 ENCOUNTER — Other Ambulatory Visit: Payer: Self-pay | Admitting: Family Medicine

## 2012-07-27 ENCOUNTER — Other Ambulatory Visit: Payer: Self-pay

## 2012-08-14 ENCOUNTER — Ambulatory Visit (INDEPENDENT_AMBULATORY_CARE_PROVIDER_SITE_OTHER): Payer: 59 | Admitting: Family Medicine

## 2012-08-14 ENCOUNTER — Encounter: Payer: Self-pay | Admitting: Family Medicine

## 2012-08-14 VITALS — BP 134/78 | HR 88 | Wt 145.0 lb

## 2012-08-14 DIAGNOSIS — E119 Type 2 diabetes mellitus without complications: Secondary | ICD-10-CM

## 2012-08-14 DIAGNOSIS — Z780 Asymptomatic menopausal state: Secondary | ICD-10-CM

## 2012-08-14 DIAGNOSIS — G4700 Insomnia, unspecified: Secondary | ICD-10-CM

## 2012-08-14 DIAGNOSIS — I10 Essential (primary) hypertension: Secondary | ICD-10-CM

## 2012-08-14 LAB — POCT GLYCOSYLATED HEMOGLOBIN (HGB A1C): Hemoglobin A1C: 6.1

## 2012-08-14 MED ORDER — QUETIAPINE FUMARATE 50 MG PO TABS: 50.0000 mg | ORAL_TABLET | Freq: Every day | ORAL | Status: DC

## 2012-08-14 NOTE — Progress Notes (Signed)
  Subjective:    Patient ID: APPLE DEARMAS, female    DOB: [birth year], 57 y.o.   MRN: [id number]  HPI IFG - has been exercising some more.  No increased thirst or urination. She has lost a few pounds with her attempts at exercise and diet. She does continue to smoke. She is on metformin tolerating it well without side effects.  HTN-  Pt denies chest pain, SOB, dizziness, or heart palpitations.  Taking meds as directed w/o problems.  Denies medication side effects.  No lower extremity swelling. She's tolerating her medications well without side effects.  Insomnia-she would like a 90 day supply on the Seroquel. She says she's tried to stop it but really just cannot sleep at all without it. Review of Systems     Objective:   Physical Exam  Constitutional: She is oriented to person, place, and time. She appears well-developed and well-nourished.  HENT:  Head: Normocephalic and atraumatic.  Cardiovascular: Normal rate, regular rhythm and normal heart sounds.   Pulmonary/Chest: Effort normal and breath sounds normal.  Neurological: She is alert and oriented to person, place, and time.  Skin: Skin is warm and dry.  Psychiatric: She has a normal mood and affect. Her behavior is normal.          Assessment & Plan:  IFG- A1C is 6.1.  Takting metformin. Recheck in 6 months.    HTN - Well controlled. Continue current regimen. Followup in 6 months. Continue work on exercise and diet. Congratulated her on her recent weight loss.  Tobacco abuse-encourage cessation and encouraged her to think about getting a pneumonia vaccine. She says she's not repeated today but will think about it.  Insomnia-continue Seroquel. It works very well for her and has failed other sleep agents. 90 day supply sent to pharmacy. He is

## 2012-08-15 ENCOUNTER — Telehealth: Payer: Self-pay | Admitting: *Deleted

## 2012-08-15 NOTE — Telephone Encounter (Signed)
Pt cannot go downstairs for bone density because of her insurance. Scheduled [facility] at [facility] in [place] at 1015 on 8/11.Pt notified

## 2012-08-20 ENCOUNTER — Other Ambulatory Visit: Payer: Self-pay | Admitting: Family Medicine

## 2012-08-28 ENCOUNTER — Other Ambulatory Visit: Payer: 59

## 2012-09-09 ENCOUNTER — Other Ambulatory Visit: Payer: Self-pay | Admitting: Family Medicine

## 2012-09-26 ENCOUNTER — Other Ambulatory Visit: Payer: Self-pay | Admitting: *Deleted

## 2012-09-26 MED ORDER — METFORMIN HCL ER 500 MG PO TB24: ORAL_TABLET | ORAL | Status: DC

## 2012-09-26 MED ORDER — ROSUVASTATIN CALCIUM 40 MG PO TABS: 40.0000 mg | ORAL_TABLET | Freq: Every day | ORAL | Status: DC

## 2012-09-26 MED ORDER — LISINOPRIL 20 MG PO TABS: ORAL_TABLET | ORAL | Status: DC

## 2012-09-26 MED ORDER — QUETIAPINE FUMARATE 50 MG PO TABS: 50.0000 mg | ORAL_TABLET | Freq: Every day | ORAL | Status: DC

## 2012-09-28 ENCOUNTER — Telehealth: Payer: Self-pay | Admitting: *Deleted

## 2012-09-28 NOTE — Telephone Encounter (Signed)
Pt calls and LM on triage line stating that [facility] can not process 2 of her prescriptions that Amber had sent over.  Called pt back and had to leave message on VM to return my call. Barry Dienes, LPN

## 2012-11-23 ENCOUNTER — Other Ambulatory Visit: Payer: Self-pay

## 2012-12-03 ENCOUNTER — Other Ambulatory Visit: Payer: Self-pay | Admitting: Family Medicine

## 2012-12-05 ENCOUNTER — Other Ambulatory Visit: Payer: Self-pay | Admitting: Family Medicine

## 2012-12-05 MED ORDER — VENLAFAXINE HCL ER 75 MG PO CP24: ORAL_CAPSULE | ORAL | Status: DC

## 2013-01-24 ENCOUNTER — Telehealth: Payer: Self-pay | Admitting: *Deleted

## 2013-01-24 NOTE — Telephone Encounter (Signed)
Pt called to get activation code for mychart. lvm for return call.Krista Love, Krista Love

## 2013-01-25 NOTE — Telephone Encounter (Signed)
Pt could not remember log in for my chart gave pt following # [phone] to call for assistance.Heath GoldBarkley, Chrisanne Loose [name]'

## 2013-03-04 ENCOUNTER — Other Ambulatory Visit: Payer: Self-pay | Admitting: Family Medicine

## 2013-03-05 ENCOUNTER — Other Ambulatory Visit: Payer: Self-pay | Admitting: *Deleted

## 2013-03-05 DIAGNOSIS — Z0000 Encounter for general adult medical examination without abnormal findings: Secondary | ICD-10-CM

## 2013-03-05 DIAGNOSIS — E348 Other specified endocrine disorders: Secondary | ICD-10-CM

## 2013-03-05 MED ORDER — LISINOPRIL 20 MG PO TABS: ORAL_TABLET | ORAL | Status: DC

## 2013-03-05 MED ORDER — QUETIAPINE FUMARATE 50 MG PO TABS: ORAL_TABLET | ORAL | Status: DC

## 2013-03-05 NOTE — Telephone Encounter (Signed)
Called pt and lvm informing her that refill can only be given for a short term due to her needing an appt. I see that she does have an appt scheduled on 3.13.15 however, looks like she may be able to wait until her appt before refill is given. Asked her to call me back to clarify.Loralee PacasBarkley, Cyris Maalouf [place]etta

## 2013-03-26 LAB — COMPREHENSIVE METABOLIC PANEL
ALT: 17 U/L (ref 0–35)
AST: 15 U/L (ref 0–37)
Albumin: 4.5 g/dL (ref 3.5–5.2)
Alkaline Phosphatase: 89 U/L (ref 39–117)
BUN: 12 mg/dL (ref 6–23)
CO2: 29 meq/L (ref 19–32)
CREATININE: 0.69 mg/dL (ref 0.50–1.10)
Calcium: 9.6 mg/dL (ref 8.4–10.5)
Chloride: 100 mEq/L (ref 96–112)
GLUCOSE: 111 mg/dL — AB (ref 70–99)
POTASSIUM: 4.2 meq/L (ref 3.5–5.3)
Sodium: 138 mEq/L (ref 135–145)
Total Bilirubin: 0.3 mg/dL (ref 0.2–1.2)
Total Protein: 7.1 g/dL (ref 6.0–8.3)

## 2013-03-26 LAB — HEMOGLOBIN A1C
HEMOGLOBIN A1C: 6.1 % — AB (ref <5.7)
MEAN PLASMA GLUCOSE: 128 mg/dL — AB (ref <117)

## 2013-03-26 LAB — LIPID PANEL
CHOLESTEROL: 192 mg/dL (ref 0–200)
HDL: 63 mg/dL (ref >39)
LDL Cholesterol: 93 mg/dL (ref 0–99)
TRIGLYCERIDES: 182 mg/dL — AB (ref <150)
Total CHOL/HDL Ratio: 3 Ratio
VLDL: 36 mg/dL (ref 0–40)

## 2013-03-30 ENCOUNTER — Encounter: Payer: Self-pay | Admitting: Family Medicine

## 2013-03-30 ENCOUNTER — Ambulatory Visit (INDEPENDENT_AMBULATORY_CARE_PROVIDER_SITE_OTHER): Payer: 59 | Admitting: Family Medicine

## 2013-03-30 VITALS — BP 127/66 | HR 93 | Ht 63.0 in | Wt 151.0 lb

## 2013-03-30 DIAGNOSIS — E785 Hyperlipidemia, unspecified: Secondary | ICD-10-CM

## 2013-03-30 DIAGNOSIS — I10 Essential (primary) hypertension: Secondary | ICD-10-CM

## 2013-03-30 DIAGNOSIS — E348 Other specified endocrine disorders: Secondary | ICD-10-CM

## 2013-03-30 DIAGNOSIS — Z0000 Encounter for general adult medical examination without abnormal findings: Secondary | ICD-10-CM

## 2013-03-30 LAB — POCT UA - MICROALBUMIN
Albumin/Creatinine Ratio, Urine, POC: <30
CREATININE, POC: 50 mg/dL
Microalbumin Ur, POC: 10 mg/L

## 2013-03-30 MED ORDER — ROSUVASTATIN CALCIUM 40 MG PO TABS: 40.0000 mg | ORAL_TABLET | Freq: Every day | ORAL | Status: DC

## 2013-03-30 MED ORDER — METFORMIN HCL ER 500 MG PO TB24: ORAL_TABLET | ORAL | Status: DC

## 2013-03-30 NOTE — Addendum Note (Signed)
Addended by: Deno EtienneBARKLEY, Brewer Hitchman L on: 03/30/2013 01:10 PM   Modules accepted: Orders

## 2013-03-30 NOTE — Progress Notes (Signed)
  Subjective:     Krista Love is a 58 y.o. female and is here for a comprehensive physical exam. The patient reports no problems.  History   Social History  . Marital Status: Married    Spouse Name: N/A    Number of Children: 1  . Years of Education: N/A   Occupational History  . 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 Smoking status: Current Every Day Smoker -- 0.50 packs/day    Types: Cigarettes  . Smokeless tobacco: Not on file  . Alcohol Use: Yes  . Drug Use: No  . Sexual Activity: Not Currently   Other Topics Concern  . Not on file   Social History Narrative   No regular exercise. 2 cups of coffee a days.    Health Maintenance  Topic Date Due  . Pneumococcal Polysaccharide Vaccine (##1) 04/24/1955  . Influenza Vaccine  08/18/2012  . Urine Microalbumin  11/02/2012  . Colonoscopy  01/18/2013  . Mammogram  09/20/2013  . Ophthalmology Exam  09/21/2013  . Hemoglobin A1c  09/26/2013  . Pap Smear  12/13/2013  . Foot Exam  03/31/2014  . Tetanus/tdap  01/19/2016    The following portions of the patient's history were reviewed and updated as appropriate: allergies, current medications, past family history, past medical history, past social history, past surgical history and problem list.  Review of Systems A comprehensive review of systems was negative.   Objective:    BP 127/66  Pulse 93  Ht 5\' 3"  (1.6 m)  Wt 151 lb (68.493 kg)  BMI 26.76 kg/m2  SpO2 98% General appearance: alert, cooperative and appears stated age Head: Normocephalic, without obvious abnormality, atraumatic Eyes: conj clear, EOMi, PEERLA Ears: normal TM's and external ear canals both ears Nose: Nares normal. Septum midline. Mucosa normal. No drainage or sinus tenderness. Throat: lips, mucosa, and tongue normal; teeth and gums normal Neck: no adenopathy, no carotid bruit, no JVD, supple, symmetrical, trachea midline and thyroid not enlarged, symmetric, no  tenderness/mass/nodules Back: symmetric, no curvature. ROM normal. No CVA tenderness. Lungs: clear to auscultation bilaterally Breasts: normal appearance, no masses or tenderness Heart: regular rate and rhythm, S1, S2 normal, no murmur, click, rub or gallop Abdomen: soft, non-tender; bowel sounds normal; no masses,  no organomegaly Pulses: 2+ and symmetric Skin: Skin color, texture, turgor normal. No rashes or lesions Lymph nodes: Cervical, supraclavicular, and axillary nodes normal. Neurologic: Alert and oriented X 3, normal strength and tone. Normal symmetric reflexes. Normal coordination and gait    Assessment:    Healthy female exam.      Plan:     See After Visit Summary for Counseling Recommendations  Keep up a regular exercise program and make sure you are eating a healthy diet Try to eat 4 servings of dairy a day, or if you are lactose intolerant take a calcium with vitamin D daily.  Your vaccines are up to date.  Reviewed results of her blood work with her today. Refill her Crestor today. Declines vaccines today.  Hypertension-well-controlled. Continue current regimen. Followup in 6 months.  Hyperlipidemia-well-controlled. Continue current regimen. Followup in 6 months.  Also remind her that she is due for her colonoscopy. His continuous. When she's ready please call so be happy place a referral.  IFG- Stable  Lab Results  Component Value Date   HGBA1C 6.1* 03/26/2013

## 2013-04-02 ENCOUNTER — Other Ambulatory Visit: Payer: Self-pay | Admitting: *Deleted

## 2013-04-02 MED ORDER — ROSUVASTATIN CALCIUM 40 MG PO TABS: 40.0000 mg | ORAL_TABLET | Freq: Every day | ORAL | Status: DC

## 2013-05-03 ENCOUNTER — Telehealth: Payer: Self-pay | Admitting: *Deleted

## 2013-05-03 ENCOUNTER — Other Ambulatory Visit: Payer: Self-pay | Admitting: *Deleted

## 2013-05-03 MED ORDER — AMBULATORY NON FORMULARY MEDICATION: Status: DC

## 2013-05-03 NOTE — Telephone Encounter (Signed)
Pt called and requested zostavax. rx sent.Deno Etienneonya L Rodger Giangregorio

## 2013-05-07 ENCOUNTER — Other Ambulatory Visit: Payer: Self-pay | Admitting: *Deleted

## 2013-05-07 MED ORDER — AMBULATORY NON FORMULARY MEDICATION: Status: DC

## 2013-05-11 ENCOUNTER — Other Ambulatory Visit: Payer: Self-pay | Admitting: Family Medicine

## 2013-05-28 ENCOUNTER — Encounter: Payer: Self-pay | Admitting: Family Medicine

## 2013-06-02 ENCOUNTER — Other Ambulatory Visit: Payer: Self-pay | Admitting: Family Medicine

## 2013-08-09 ENCOUNTER — Other Ambulatory Visit: Payer: Self-pay | Admitting: Family Medicine

## 2013-08-10 ENCOUNTER — Other Ambulatory Visit: Payer: Self-pay | Admitting: Family Medicine

## 2013-08-14 ENCOUNTER — Other Ambulatory Visit: Payer: Self-pay | Admitting: Family Medicine

## 2013-09-03 ENCOUNTER — Other Ambulatory Visit: Payer: Self-pay | Admitting: Family Medicine

## 2013-09-12 ENCOUNTER — Ambulatory Visit (INDEPENDENT_AMBULATORY_CARE_PROVIDER_SITE_OTHER): Payer: 59 | Admitting: Family Medicine

## 2013-09-12 ENCOUNTER — Telehealth: Payer: Self-pay | Admitting: *Deleted

## 2013-09-12 ENCOUNTER — Encounter: Payer: Self-pay | Admitting: Family Medicine

## 2013-09-12 VITALS — BP 138/76 | HR 92 | Ht 63.0 in | Wt 154.0 lb

## 2013-09-12 DIAGNOSIS — I10 Essential (primary) hypertension: Secondary | ICD-10-CM

## 2013-09-12 DIAGNOSIS — Z0000 Encounter for general adult medical examination without abnormal findings: Secondary | ICD-10-CM

## 2013-09-12 DIAGNOSIS — F341 Dysthymic disorder: Secondary | ICD-10-CM

## 2013-09-12 DIAGNOSIS — Z01419 Encounter for gynecological examination (general) (routine) without abnormal findings: Secondary | ICD-10-CM

## 2013-09-12 DIAGNOSIS — Z23 Encounter for immunization: Secondary | ICD-10-CM

## 2013-09-12 DIAGNOSIS — G4700 Insomnia, unspecified: Secondary | ICD-10-CM

## 2013-09-12 DIAGNOSIS — Z1231 Encounter for screening mammogram for malignant neoplasm of breast: Secondary | ICD-10-CM

## 2013-09-12 DIAGNOSIS — E348 Other specified endocrine disorders: Secondary | ICD-10-CM

## 2013-09-12 LAB — POCT GLYCOSYLATED HEMOGLOBIN (HGB A1C): Hemoglobin A1C: 6.3

## 2013-09-12 MED ORDER — LISINOPRIL 20 MG PO TABS: ORAL_TABLET | ORAL | Status: DC

## 2013-09-12 MED ORDER — ROSUVASTATIN CALCIUM 40 MG PO TABS: 40.0000 mg | ORAL_TABLET | Freq: Every day | ORAL | Status: DC

## 2013-09-12 MED ORDER — QUETIAPINE FUMARATE 100 MG PO TABS: ORAL_TABLET | ORAL | Status: DC

## 2013-09-12 NOTE — Patient Instructions (Signed)
Keep up a regular exercise program and make sure you are eating a healthy diet Try to eat 4 servings of dairy a day, or if you are lactose intolerant take a calcium with vitamin D daily.  Your vaccines are up to date.   

## 2013-09-12 NOTE — Progress Notes (Signed)
Subjective:    Patient ID: Krista Love, female    DOB: Dec 14, 1955, 58 y.o.   MRN: [id number]  HPI IFG - no increased thirst or urination.Eye exam is schedule for next week.   Hypertension- Pt denies chest pain, SOB, dizziness, or heart palpitations.  Taking meds as directed w/o problems.  Denies medication side effects.    Anxiety/Depression - she would like to increase her Seroquel to help her with her sleep. She does complain of some trouble relaxing but overall feels like her anxiousness and nervousness is well-controlled. She denies feeling down and depressed. She does report a little interest or pleasure in doing things several days a week ago. And poor appetite. She feels like the venlafaxine is working well.  Review of Systems     Objective:   Physical Exam  Constitutional: She is oriented to person, place, and time. She appears well-developed and well-nourished.  HENT:  Head: Normocephalic and atraumatic.  Cardiovascular: Normal rate, regular rhythm and normal heart sounds.   Pulmonary/Chest: Effort normal and breath sounds normal.  Neurological: She is alert and oriented to person, place, and time.  Skin: Skin is warm and dry.  Psychiatric: She has a normal mood and affect. Her behavior is normal.          Assessment & Plan:  IFG - A1c is 6.3 today. Continue to work on diet and exercise and f/U every 6 months.    HTN - well controlled. Repeat blood pressure looks much better today. She was rushing when she first came in.  Anxiety/Depression -score 2. PHQ 9 score of 5 today. Continue venlafaxine current dose.  Insomnia - wants to increase her seroquel . Will inc to . call if not helpful.   Shingles vaccine given today.   Due for colonoscopy.      Subjective:     Krista Love is a 58 y.o. female and is here for a comprehensive physical exam. The patient reports no problems.  History   Social History  . Marital Status: Married    Spouse Name: N/A     Number of Children: 1  . Years of Education: N/A   Occupational History  . 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 Smoking status: Current Every Day Smoker -- 0.50 packs/day    Types: Cigarettes  . Smokeless tobacco: Not on file  . Alcohol Use: Yes  . Drug Use: No  . Sexual Activity: Not Currently   Other Topics Concern  . Not on file   Social History Narrative   No regular exercise. 2 cups of coffee a days.    Health Maintenance  Topic Date Due  . Colonoscopy  01/18/2013  . Zostavax  04/23/2013  . Pneumococcal Polysaccharide Vaccine (##1) 03/31/2014 (Originally 04/24/1957)  . Influenza Vaccine  09/13/2014 (Originally 08/18/2013)  . Mammogram  09/20/2013  . Ophthalmology Exam  09/21/2013  . Hemoglobin A1c  09/26/2013  . Pap Smear  12/13/2013  . Foot Exam  03/31/2014  . Urine Microalbumin  03/31/2014  . Tetanus/tdap  01/19/2016    The following portions of the patient's history were reviewed and updated as appropriate: allergies, current medications, past family history, past medical history, past social history, past surgical history and problem list.  Review of Systems A comprehensive review of systems was negative.   Objective:    BP 138/76  Pulse 92  Ht  (1.6 m)  Wt 154 lb (69.854 kg)  BMI 27.29  kg/m2 General appearance: alert, cooperative and appears stated age Head: Normocephalic, without obvious abnormality, atraumatic Eyes: conj clear, EOMi PEERLA Ears: normal TM's and external ear canals both ears Nose: Nares normal. Septum midline. Mucosa normal. No drainage or sinus tenderness. Throat: lips, mucosa, and tongue normal; teeth and gums normal Neck: no adenopathy, no carotid bruit, no JVD, supple, symmetrical, trachea midline and thyroid not enlarged, symmetric, no tenderness/mass/nodules Back: symmetric, no curvature. ROM normal. No CVA tenderness. Lungs: clear to auscultation bilaterally Breasts: normal appearance, no masses or  tenderness Heart: regular rate and rhythm, S1, S2 normal, no murmur, click, rub or gallop Abdomen: soft, non-tender; bowel sounds normal; no masses,  no organomegaly Pelvic: cervix normal in appearance, external genitalia normal, no adnexal masses or tenderness, no cervical motion tenderness, rectovaginal septum normal, uterus normal size, shape, and consistency and vagina normal without discharge Extremities: extremities normal, atraumatic, no cyanosis or edema Pulses: 2+ and symmetric Skin: Skin color, texture, turgor normal. No rashes or lesions Lymph nodes: Cervical, supraclavicular, and axillary nodes normal. Neurologic: Alert and oriented X 3, normal strength and tone. Normal symmetric reflexes. Normal coordination and gait    Assessment:    Healthy female exam.      Plan:     See After Visit Summary for Counseling Recommendations  Keep up a regular exercise program and make sure you are eating a healthy diet Try to eat 4 servings of dairy a day, or if you are lactose intolerant take a calcium with vitamin D daily.  Your vaccines are up to date.   Discussed need for colonoscopy.  Declined flu vaccine.  Shingles vaccine given  Form completed for work.

## 2013-09-12 NOTE — Telephone Encounter (Signed)
Form completed and faxed and placed in scan folder.[name] Bruce

## 2013-09-13 LAB — CYTOLOGY - PAP

## 2013-09-13 NOTE — Progress Notes (Signed)
Quick Note:  Call patient: Your Pap smear is normal. Repeat in 2-3 years. ______ 

## 2013-10-09 ENCOUNTER — Telehealth: Payer: Self-pay | Admitting: *Deleted

## 2013-10-09 NOTE — Telephone Encounter (Signed)
Pt called and wanted to know if we would accept orders for her pre-op in December. I told her if she would have her surgeons office to fax over the information and she will need to schedule an appt to have this done.Krista Love [place]

## 2013-10-20 ENCOUNTER — Other Ambulatory Visit: Payer: Self-pay | Admitting: Family Medicine

## 2013-11-02 ENCOUNTER — Encounter: Payer: Self-pay | Admitting: Family Medicine

## 2013-11-02 ENCOUNTER — Ambulatory Visit (INDEPENDENT_AMBULATORY_CARE_PROVIDER_SITE_OTHER): Payer: 59 | Admitting: Family Medicine

## 2013-11-02 VITALS — BP 129/85 | HR 95 | Ht 63.0 in | Wt 157.0 lb

## 2013-11-02 DIAGNOSIS — Z01818 Encounter for other preprocedural examination: Secondary | ICD-10-CM

## 2013-11-02 DIAGNOSIS — Z136 Encounter for screening for cardiovascular disorders: Secondary | ICD-10-CM

## 2013-11-02 LAB — CBC
HCT: 37.9 % (ref 36.0–46.0)
HEMOGLOBIN: 12.7 g/dL (ref 12.0–15.0)
MCH: 30.2 pg (ref 26.0–34.0)
MCHC: 33.5 g/dL (ref 30.0–36.0)
MCV: 90.2 fL (ref 78.0–100.0)
PLATELETS: 265 10*3/uL (ref 150–400)
RBC: 4.2 MIL/uL (ref 3.87–5.11)
RDW: 14.1 % (ref 11.5–15.5)
WBC: 8 10*3/uL (ref 4.0–10.5)

## 2013-11-02 LAB — COMPLETE METABOLIC PANEL WITH GFR
ALT: 24 U/L (ref 0–35)
AST: 17 U/L (ref 0–37)
Albumin: 4.6 g/dL (ref 3.5–5.2)
Alkaline Phosphatase: 103 U/L (ref 39–117)
BILIRUBIN TOTAL: 0.4 mg/dL (ref 0.2–1.2)
BUN: 12 mg/dL (ref 6–23)
CO2: 27 mEq/L (ref 19–32)
Calcium: 9.7 mg/dL (ref 8.4–10.5)
Chloride: 102 mEq/L (ref 96–112)
Creat: 0.89 mg/dL (ref 0.50–1.10)
GFR, EST AFRICAN AMERICAN: 81 mL/min
GFR, Est Non African American: 71 mL/min
Glucose, Bld: 87 mg/dL (ref 70–99)
Potassium: 4.2 mEq/L (ref 3.5–5.3)
Sodium: 138 mEq/L (ref 135–145)
Total Protein: 6.9 g/dL (ref 6.0–8.3)

## 2013-11-02 LAB — PROTIME-INR
INR: 1.01 (ref <1.50)
Prothrombin Time: 13.3 seconds (ref 11.6–15.2)

## 2013-11-02 LAB — APTT: APTT: 33 s (ref 24–37)

## 2013-11-02 NOTE — Progress Notes (Signed)
   Subjective:    Patient ID: Krista Love, female    DOB: 09/08/1955, 58 y.o.   MRN: [id number]  HPI Patient is here today for an EKG and blood work. She needs these for clearance for surgery for a cosmetic abdominal surgery. She does not require a preoperative examination.  Filed Vitals:   11/02/13 0823  BP: 129/85  Pulse: 95     Review of Systems     Objective:   Physical Exam  Constitutional: She is oriented to person, place, and time. She appears well-developed and well-nourished.  HENT:  Head: Normocephalic and atraumatic.  Eyes: Conjunctivae and EOM are normal.  Cardiovascular: Normal rate.   Pulmonary/Chest: Effort normal.  Neurological: She is alert and oriented to person, place, and time.  Skin: Skin is dry. No pallor.  Psychiatric: She has a normal mood and affect. Her behavior is normal.          Assessment & Plan:  EKG today shows rate of 80 beats per minute, normal sinus rhythm with normal axis and no acute ST-T wave changes. We also drew blood work for CBC, CMP, PT, PTT, and factor V Leiden. We will fax these over [phone] attention [facility].

## 2013-11-07 LAB — FACTOR 5 LEIDEN

## 2013-11-08 ENCOUNTER — Telehealth: Payer: Self-pay | Admitting: *Deleted

## 2013-11-08 NOTE — Telephone Encounter (Signed)
Results for pre-op testing.Krista PacasBarkley, Jairy Angulo CuartelezLynetta

## 2013-11-29 ENCOUNTER — Ambulatory Visit (INDEPENDENT_AMBULATORY_CARE_PROVIDER_SITE_OTHER): Payer: 59

## 2013-11-29 DIAGNOSIS — Z1231 Encounter for screening mammogram for malignant neoplasm of breast: Secondary | ICD-10-CM

## 2013-12-02 ENCOUNTER — Other Ambulatory Visit: Payer: Self-pay | Admitting: Family Medicine

## 2013-12-05 ENCOUNTER — Other Ambulatory Visit: Payer: Self-pay | Admitting: Family Medicine

## 2013-12-06 ENCOUNTER — Encounter: Payer: Self-pay | Admitting: Family Medicine

## 2013-12-06 ENCOUNTER — Other Ambulatory Visit: Payer: Self-pay | Admitting: *Deleted

## 2013-12-06 MED ORDER — LISINOPRIL-HYDROCHLOROTHIAZIDE 20-25 MG PO TABS: 1.0000 | ORAL_TABLET | Freq: Every day | ORAL | Status: DC

## 2013-12-06 MED ORDER — QUETIAPINE FUMARATE 100 MG PO TABS: ORAL_TABLET | ORAL | Status: DC

## 2013-12-06 NOTE — Telephone Encounter (Signed)
Will change lisinopril to combo medication that has hydrochlorothiazide in it. Recommend follow-up in one month to evaluate blood pressure and make sure that it's coming down.

## 2013-12-07 ENCOUNTER — Telehealth: Payer: Self-pay | Admitting: Family Medicine

## 2013-12-07 NOTE — Telephone Encounter (Signed)
No fasting needed

## 2013-12-07 NOTE — Telephone Encounter (Signed)
Appointment For: Elon SpannerMCHONE,Namira K ([id number])    Visit Type: MYCHART OFFICE VISIT (1064)      01/17/2014  8:15 AM 15 mins. Agapito Gamesatherine D Metheney, MD [facility]      Patient Comments:   Office Visit   Follow up on new lisinopril prescription and labs     Is this a fasting lab?

## 2013-12-10 ENCOUNTER — Other Ambulatory Visit: Payer: Self-pay | Admitting: Family Medicine

## 2013-12-12 ENCOUNTER — Other Ambulatory Visit: Payer: Self-pay

## 2013-12-12 MED ORDER — LISINOPRIL-HYDROCHLOROTHIAZIDE 20-25 MG PO TABS: 1.0000 | ORAL_TABLET | Freq: Every day | ORAL | Status: DC

## 2014-01-17 ENCOUNTER — Encounter: Payer: Self-pay | Admitting: Family Medicine

## 2014-01-17 ENCOUNTER — Ambulatory Visit (INDEPENDENT_AMBULATORY_CARE_PROVIDER_SITE_OTHER): Payer: 59 | Admitting: Family Medicine

## 2014-01-17 VITALS — BP 104/64 | HR 72 | Wt 156.0 lb

## 2014-01-17 DIAGNOSIS — Z720 Tobacco use: Secondary | ICD-10-CM

## 2014-01-17 DIAGNOSIS — E8881 Metabolic syndrome: Secondary | ICD-10-CM

## 2014-01-17 DIAGNOSIS — I10 Essential (primary) hypertension: Secondary | ICD-10-CM

## 2014-01-17 DIAGNOSIS — E119 Type 2 diabetes mellitus without complications: Secondary | ICD-10-CM

## 2014-01-17 LAB — POCT GLYCOSYLATED HEMOGLOBIN (HGB A1C): Hemoglobin A1C: 6.6

## 2014-01-17 MED ORDER — METFORMIN HCL ER (OSM) 1000 MG PO TB24: 1000.0000 mg | ORAL_TABLET | Freq: Every day | ORAL | Status: DC

## 2014-01-17 MED ORDER — LISINOPRIL-HYDROCHLOROTHIAZIDE 20-12.5 MG PO TABS: 1.0000 | ORAL_TABLET | Freq: Every day | ORAL | Status: DC

## 2014-01-17 NOTE — Progress Notes (Signed)
   Subjective:    Patient ID: Krista Love, female    DOB: 10/23/1955, 58 y.o.   MRN: [id number]  HPI Hypertension- Pt denies chest pain, SOB, dizziness, or heart palpitations.  Taking meds as directed w/o problems.  Denies medication side effects.  Has been more tired lately.    IFG - last a1c 6.3 in august.   No increased thirst or urination. She has been eating a fair amount of posterior lately over the holidays.    Review of Systems     Objective:   Physical Exam  Constitutional: She is oriented to person, place, and time. She appears well-developed and well-nourished.  HENT:  Head: Normocephalic and atraumatic.  Cardiovascular: Normal rate, regular rhythm and normal heart sounds.   Pulmonary/Chest: Effort normal and breath sounds normal.  Neurological: She is alert and oriented to person, place, and time.  Skin: Skin is warm and dry.  Psychiatric: She has a normal mood and affect. Her behavior is normal.          Assessment & Plan:  HTN - well controlled. In fact BP is a little low.  Will change to lisinopril HCT 20/12.5mg .   follow-up in 2-3 months to make sure still well controlled blood pressures are not too low.   DM- A1C is up to 6.6 from 6.3.   we discussed getting back on track with diet and exercise. We will also increase metformin to 1000 mg daily. New perception sent to the pharmacy. Follow-up in 3 months.  Plans on quitting smoking to have her abdominal surgery.  She has to quit for 3 weeks before her surgery.  Encouraged her. Offered to help in any way that we could. She plans to quit cold [place]. She was able to quit cold [place] years ago and had actually quit for most 10 years before starting smoking again.

## 2014-04-18 ENCOUNTER — Ambulatory Visit (INDEPENDENT_AMBULATORY_CARE_PROVIDER_SITE_OTHER): Payer: 59 | Admitting: Family Medicine

## 2014-04-18 ENCOUNTER — Encounter: Payer: Self-pay | Admitting: Family Medicine

## 2014-04-18 VITALS — BP 109/67 | HR 93 | Ht 63.0 in | Wt 142.0 lb

## 2014-04-18 DIAGNOSIS — F341 Dysthymic disorder: Secondary | ICD-10-CM

## 2014-04-18 DIAGNOSIS — G4700 Insomnia, unspecified: Secondary | ICD-10-CM | POA: Diagnosis not present

## 2014-04-18 DIAGNOSIS — E119 Type 2 diabetes mellitus without complications: Secondary | ICD-10-CM | POA: Diagnosis not present

## 2014-04-18 DIAGNOSIS — I10 Essential (primary) hypertension: Secondary | ICD-10-CM

## 2014-04-18 LAB — POCT UA - MICROALBUMIN
Albumin/Creatinine Ratio, Urine, POC: >300
Creatinine, POC: 50 mg/dL
MICROALBUMIN (UR) POC: 150 mg/L

## 2014-04-18 LAB — POCT GLYCOSYLATED HEMOGLOBIN (HGB A1C): HEMOGLOBIN A1C: 6.5

## 2014-04-18 MED ORDER — ZOLPIDEM TARTRATE 5 MG PO TABS: 5.0000 mg | ORAL_TABLET | Freq: Every evening | ORAL | Status: DC | PRN

## 2014-04-18 MED ORDER — LISINOPRIL 20 MG PO TABS: 20.0000 mg | ORAL_TABLET | Freq: Every day | ORAL | Status: DC

## 2014-04-18 NOTE — Patient Instructions (Signed)
Seroquel- cut tab in half and take half for about 3-4 nights and then can stop it.

## 2014-04-18 NOTE — Progress Notes (Signed)
   Subjective:    Patient ID: Krista Love, female    DOB: 06/09/1955, 59 y.o.   MRN: [id number]  HPI Krista Love - she would like to be able to wean her Effexor and her Seroquel. She would like to try something like Ambien for sleep. She's tried it in the past and felt like it was extremely helpful. She says she just waking up multiple times at night. She says at least 10-20 per night. Her insomnia has been chronic and has been going on for years.   Diabetes - no hypoglycemic events. No wounds or sores that are not healing well. No increased thirst or urination. Checking glucose at home. Taking medications as prescribed without any side effects. We had increased her metformin 2000 mg daily.  Hypertension-reaction decrease her blood pressure medication a little bit last time because her blood pressures running a little too low.    Review of Systems     Objective:   Physical Exam  Constitutional: She is oriented to person, place, and time. She appears well-developed and well-nourished.  HENT:  Head: Normocephalic and atraumatic.  Cardiovascular: Normal rate, regular rhythm and normal heart sounds.   Pulmonary/Chest: Effort normal and breath sounds normal.  Neurological: She is alert and oriented to person, place, and time.  Skin: Skin is warm and dry.  Psychiatric: She has a normal mood and affect. Her behavior is normal.          Assessment & Plan:  Insomnia - discussed options. Will put her on Ambien since she has response well to this in the past. Discussed that this can be habit-forming. Also discussed potential side effects and she is to stop immediately if she does any sleep walking etc. Also reminded her that she cannot take this while using alcohol.  DM- well controlled. She has felt much better after her 14 pound weight loss. Continue current regimen. Follow-up in 3 months..    Hypertension- well-controlled, and fat blood pressure still a little low today. Decrease  medication to 20mg  lisinopril daily.     Anxiety/depression-we will start with weaning the Seroquel initially. We can always look at changing the Effexor for weaning it completely. Gad 7 score of 7 today. 3 of the points were for sleep issues. She rates her symptoms as not difficult. We will discontinue the Seroquel. Instructions given to taper. We'll continue with Effexor for now and readdress when I see her back in 3 months.

## 2014-05-09 ENCOUNTER — Telehealth: Payer: Self-pay | Admitting: *Deleted

## 2014-05-09 DIAGNOSIS — G4700 Insomnia, unspecified: Secondary | ICD-10-CM

## 2014-05-09 MED ORDER — ZOLPIDEM TARTRATE 10 MG PO TABS: 10.0000 mg | ORAL_TABLET | Freq: Every evening | ORAL | Status: DC | PRN

## 2014-05-09 NOTE — Telephone Encounter (Signed)
Okay to increase to 10 mg. Just let her know that her that insurance may or may not let her. 5 mg is the dose indicated for women.

## 2014-05-09 NOTE — Telephone Encounter (Signed)
Pt called and asked that her ambien dose be increased to 10 mg she states that the 5 mg is not as effective. She will call back with the pharmacy that she wants this to be sent to Fwd to pcp for advice.Loralee PacasBarkley, Nikcole Eischeid [place] Springs[place]etta

## 2014-05-09 NOTE — Telephone Encounter (Signed)
Attempted to contact Pt for compliance with [employer] on diabetic regulations. It has been a year since her last Lipid panel. Unsure when her last diabetic eye exam was, will find out when Pt returns clinic call.

## 2014-05-09 NOTE — Telephone Encounter (Signed)
Pt informed.Krista Love [name]  

## 2014-06-23 ENCOUNTER — Other Ambulatory Visit: Payer: Self-pay | Admitting: Family Medicine

## 2014-07-16 ENCOUNTER — Other Ambulatory Visit: Payer: Self-pay | Admitting: Family Medicine

## 2014-07-17 ENCOUNTER — Ambulatory Visit: Payer: 59 | Admitting: Family Medicine

## 2014-07-23 ENCOUNTER — Encounter: Payer: Self-pay | Admitting: Family Medicine

## 2014-07-23 ENCOUNTER — Ambulatory Visit (INDEPENDENT_AMBULATORY_CARE_PROVIDER_SITE_OTHER): Payer: 59 | Admitting: Family Medicine

## 2014-07-23 VITALS — BP 125/75 | HR 91 | Ht 63.0 in | Wt 148.0 lb

## 2014-07-23 DIAGNOSIS — E785 Hyperlipidemia, unspecified: Secondary | ICD-10-CM

## 2014-07-23 DIAGNOSIS — Z1159 Encounter for screening for other viral diseases: Secondary | ICD-10-CM

## 2014-07-23 DIAGNOSIS — G4700 Insomnia, unspecified: Secondary | ICD-10-CM

## 2014-07-23 DIAGNOSIS — E119 Type 2 diabetes mellitus without complications: Secondary | ICD-10-CM

## 2014-07-23 DIAGNOSIS — Z114 Encounter for screening for human immunodeficiency virus [HIV]: Secondary | ICD-10-CM

## 2014-07-23 DIAGNOSIS — I10 Essential (primary) hypertension: Secondary | ICD-10-CM

## 2014-07-23 DIAGNOSIS — F341 Dysthymic disorder: Secondary | ICD-10-CM

## 2014-07-23 LAB — LIPID PANEL
Cholesterol: 188 mg/dL (ref 0–200)
HDL: 64 mg/dL (ref >=46)
LDL Cholesterol: 86 mg/dL (ref 0–99)
TRIGLYCERIDES: 189 mg/dL — AB (ref <150)
Total CHOL/HDL Ratio: 2.9 Ratio
VLDL: 38 mg/dL (ref 0–40)

## 2014-07-23 LAB — POCT GLYCOSYLATED HEMOGLOBIN (HGB A1C): HEMOGLOBIN A1C: 6.7

## 2014-07-23 NOTE — Addendum Note (Signed)
Addended by: Nani GasserMETHENEY, Hajar Penninger D on: 07/23/2014 08:46 AM   Modules accepted: Orders

## 2014-07-23 NOTE — Progress Notes (Addendum)
   Subjective:    Patient ID: Elon Spannernnie K Larch, female    DOB: 09/01/1955, 59 y.o.   MRN: [id number]  HPI Diabetes - no hypoglycemic events. No wounds or sores that are not healing well. No increased thirst or urination. Checking glucose at home. Taking medications as prescribed without any side effects.  Sleep - Says the Remus Lofflerambien is working well. She is taking it most nights.  No sedative effects.  No HA.    Anxiety/depression - doing well on the effexor. She is happy with a regimen.   Review of Systems     Objective:   Physical Exam  Constitutional: She is oriented to person, place, and time. She appears well-developed and well-nourished.  HENT:  Head: Normocephalic and atraumatic.  Neck: Neck supple. No thyromegaly present.  Cardiovascular: Normal rate, regular rhythm and normal heart sounds.   Pulmonary/Chest: Effort normal and breath sounds normal.  Lymphadenopathy:    She has no cervical adenopathy.  Neurological: She is alert and oriented to person, place, and time.  Skin: Skin is warm and dry.  Psychiatric: She has a normal mood and affect. Her behavior is normal.          Assessment & Plan:  DM- well controlled.  A1C is up some. Work on diet and exercise. F/U in 3 months.   Insomnia- well controlled on Ambien.   Anxiety/depression - stable on current dose of Effexor.   Screening colon cancer - she is interested in ColoGuard. H.O given and discussed.

## 2014-07-24 LAB — HEPATITIS C ANTIBODY: HCV Ab: NEGATIVE

## 2014-07-24 LAB — HIV ANTIBODY (ROUTINE TESTING W REFLEX): HIV: NONREACTIVE

## 2014-07-25 ENCOUNTER — Telehealth: Payer: Self-pay | Admitting: *Deleted

## 2014-07-25 NOTE — Telephone Encounter (Signed)
Called pt and informed her that the procedure code for the cologuard is [id number].Loralee PacasBarkley, Arriana Lohmann BentleyLynetta

## 2014-08-13 ENCOUNTER — Other Ambulatory Visit: Payer: Self-pay | Admitting: Family Medicine

## 2014-08-19 ENCOUNTER — Encounter: Payer: Self-pay | Admitting: Family Medicine

## 2014-08-26 ENCOUNTER — Telehealth: Payer: Self-pay | Admitting: *Deleted

## 2014-08-26 DIAGNOSIS — Z1211 Encounter for screening for malignant neoplasm of colon: Secondary | ICD-10-CM

## 2014-08-26 NOTE — Telephone Encounter (Signed)
Pt stated that her insurance will not pay for the cologaurd and will need a referral for GI.Marland KitchenLoralee Love [place]

## 2014-09-21 ENCOUNTER — Other Ambulatory Visit: Payer: Self-pay | Admitting: Family Medicine

## 2014-09-27 ENCOUNTER — Other Ambulatory Visit: Payer: Self-pay | Admitting: Family Medicine

## 2014-10-01 LAB — HM DIABETES EYE EXAM

## 2014-10-24 ENCOUNTER — Other Ambulatory Visit: Payer: Self-pay | Admitting: Family Medicine

## 2014-11-05 ENCOUNTER — Encounter: Payer: Self-pay | Admitting: Family Medicine

## 2014-11-11 ENCOUNTER — Other Ambulatory Visit: Payer: Self-pay | Admitting: *Deleted

## 2014-11-11 DIAGNOSIS — G4700 Insomnia, unspecified: Secondary | ICD-10-CM

## 2014-11-11 MED ORDER — ZOLPIDEM TARTRATE 10 MG PO TABS: 10.0000 mg | ORAL_TABLET | Freq: Every evening | ORAL | Status: DC | PRN

## 2014-11-11 MED ORDER — ZOLPIDEM TARTRATE 10 MG PO TABS
10.0000 mg | ORAL_TABLET | Freq: Every evening | ORAL | Status: DC | PRN
Start: 2014-11-11 — End: 2014-11-11

## 2014-11-19 ENCOUNTER — Encounter: Payer: Self-pay | Admitting: Family Medicine

## 2014-11-19 ENCOUNTER — Ambulatory Visit (INDEPENDENT_AMBULATORY_CARE_PROVIDER_SITE_OTHER): Payer: 59 | Admitting: Family Medicine

## 2014-11-19 VITALS — BP 118/75 | HR 91 | Wt 148.3 lb

## 2014-11-19 DIAGNOSIS — G4700 Insomnia, unspecified: Secondary | ICD-10-CM

## 2014-11-19 DIAGNOSIS — E119 Type 2 diabetes mellitus without complications: Secondary | ICD-10-CM

## 2014-11-19 DIAGNOSIS — Z23 Encounter for immunization: Secondary | ICD-10-CM

## 2014-11-19 DIAGNOSIS — Z720 Tobacco use: Secondary | ICD-10-CM

## 2014-11-19 DIAGNOSIS — I10 Essential (primary) hypertension: Secondary | ICD-10-CM

## 2014-11-19 LAB — POCT GLYCOSYLATED HEMOGLOBIN (HGB A1C): Hemoglobin A1C: 6.5

## 2014-11-19 MED ORDER — VENLAFAXINE HCL ER 75 MG PO CP24: ORAL_CAPSULE | ORAL | Status: DC

## 2014-11-19 MED ORDER — ROSUVASTATIN CALCIUM 40 MG PO TABS: 40.0000 mg | ORAL_TABLET | Freq: Every day | ORAL | Status: DC

## 2014-11-19 NOTE — Progress Notes (Signed)
   Subjective:    Patient ID: Krista Love, female    DOB: 03/12/1955, 59 y.o.   MRN: [id number]  HPI Diabetes - no hypoglycemic events. No wounds or sores that are not healing well. No increased thirst or urination. Checking glucose at home. Taking medications as prescribed without any side effects.  Hypertension- Pt denies chest pain, SOB, dizziness, or heart palpitations.  Taking meds as directed w/o problems.  Denies medication side effects.    Insomnia-she did have some questions about her Ambien today. We had sent a prescription for 90 tabs but they have only been giving her 60 when she goes to the pharmacy to get it. She does have chronic insomnia and has had it for years.  Review of Systems     Objective:   Physical Exam  Constitutional: She is oriented to person, place, and time. She appears well-developed and well-nourished.  HENT:  Head: Normocephalic and atraumatic.  Cardiovascular: Normal rate, regular rhythm and normal heart sounds.   Pulmonary/Chest: Effort normal and breath sounds normal.  Neurological: She is alert and oriented to person, place, and time.  Skin: Skin is warm and dry.  Psychiatric: She has a normal mood and affect. Her behavior is normal.        Assessment & Plan:  DM - well controlled. A1C is 6.5.  Pneumovac discussed.  F/U in 3 months.    HTN - well controlled.  Continue current regimen.  Says her eye doc saw something in her eye that showed she may be iron deficiency.  Wants this checked next time we check her labs which will be at her f/u in 3 months.   Insomnia-discussed with her that a lot of insurance plans have "quantity limits" onset medications including Ambien. It is not uncommon for them to only pay for 20 tabs of Ambien per month. I discussed with her these medications really have not been studied long-term and in the original studies were used short-term only. And that some insurance companies are sticking to that initial regimen and  not paying for chronic use. She could certainly still the tabs some nights and just take half of a tab when she feels more tired and thinks she might sleep a little better.  Tobacco abuse-encourage cessation. She says she no she's noticed a quit but she is not quite ready. Encouraged her to let me know when she is or if she would like any assistance.  Declined flu vaccine.

## 2014-11-20 ENCOUNTER — Telehealth: Payer: Self-pay

## 2014-11-20 NOTE — Telephone Encounter (Signed)
The injection  is unrelated. If she thinks she may have a yeast infection she can come in for nurse visit for wet prep.

## 2014-11-20 NOTE — Telephone Encounter (Signed)
Pt called stating she's had some vaginal itching since she's had her pneumonia vaccine yesterday. Would like to know if this is common and if there's anything we can do to help with the itching.

## 2014-11-21 NOTE — Telephone Encounter (Signed)
LMOVM

## 2014-11-22 ENCOUNTER — Encounter: Payer: Self-pay | Admitting: Family Medicine

## 2014-11-22 ENCOUNTER — Other Ambulatory Visit: Payer: Self-pay | Admitting: Family Medicine

## 2014-11-22 MED ORDER — LISINOPRIL 20 MG PO TABS: 20.0000 mg | ORAL_TABLET | Freq: Every day | ORAL | Status: DC

## 2014-11-22 MED ORDER — GLUCOSE BLOOD VI STRP: ORAL_STRIP | Status: DC

## 2014-11-28 MED ORDER — GLUCOSE BLOOD VI STRP: ORAL_STRIP | Status: DC

## 2014-11-28 NOTE — Addendum Note (Signed)
Addended by: Deno EtienneBARKLEY, Isabellarose Kope L on: 11/28/2014 05:17 PM   Modules accepted: Orders

## 2014-12-04 ENCOUNTER — Encounter: Payer: Self-pay | Admitting: Family Medicine

## 2014-12-21 ENCOUNTER — Other Ambulatory Visit: Payer: Self-pay | Admitting: Family Medicine

## 2014-12-30 ENCOUNTER — Other Ambulatory Visit: Payer: Self-pay | Admitting: Family Medicine

## 2014-12-30 DIAGNOSIS — G4700 Insomnia, unspecified: Secondary | ICD-10-CM

## 2014-12-30 NOTE — Telephone Encounter (Signed)
Why does she need 30 days supply? [facility]?

## 2014-12-30 NOTE — Telephone Encounter (Signed)
Pt states her insurance will only pay for 60 pills/month so she wants the remaining 30 pills sent to a local pharmacy where she can pay for them OOP.

## 2014-12-31 MED ORDER — ZOLPIDEM TARTRATE 10 MG PO TABS: 10.0000 mg | ORAL_TABLET | Freq: Every evening | ORAL | Status: DC | PRN

## 2014-12-31 NOTE — Telephone Encounter (Signed)
Rx faxed to local pharmacy per Pt request.

## 2014-12-31 NOTE — Telephone Encounter (Signed)
Ok will fill.  Next time we send mail order we just need to write it for 60 tabs so not as confusing.

## 2015-02-13 ENCOUNTER — Ambulatory Visit (INDEPENDENT_AMBULATORY_CARE_PROVIDER_SITE_OTHER): Payer: 59 | Admitting: Family Medicine

## 2015-02-13 ENCOUNTER — Encounter: Payer: Self-pay | Admitting: Family Medicine

## 2015-02-13 VITALS — BP 134/72 | HR 87 | Ht 63.0 in | Wt 148.0 lb

## 2015-02-13 DIAGNOSIS — R5383 Other fatigue: Secondary | ICD-10-CM | POA: Diagnosis not present

## 2015-02-13 DIAGNOSIS — E119 Type 2 diabetes mellitus without complications: Secondary | ICD-10-CM | POA: Diagnosis not present

## 2015-02-13 DIAGNOSIS — I10 Essential (primary) hypertension: Secondary | ICD-10-CM

## 2015-02-13 DIAGNOSIS — E785 Hyperlipidemia, unspecified: Secondary | ICD-10-CM

## 2015-02-13 DIAGNOSIS — G4700 Insomnia, unspecified: Secondary | ICD-10-CM

## 2015-02-13 MED ORDER — ZOLPIDEM TARTRATE 10 MG PO TABS: 10.0000 mg | ORAL_TABLET | Freq: Every evening | ORAL | Status: DC | PRN

## 2015-02-13 NOTE — Progress Notes (Signed)
   Subjective:    Patient ID: LEZLEY BEDGOOD, female    DOB: Sep 19, 1955, 60 y.o.   MRN: [id number]  HPI Diabetes - no hypoglycemic events. No wounds or sores that are not healing well. No increased thirst or urination. Checking glucose at home. Taking medications as prescribed without any side effects.  Hypertension- Pt denies chest pain, SOB, dizziness, or heart palpitations.  Taking meds as directed w/o problems.  Denies medication side effects.    Sonia-she would like a 90 day supply on the Ambien. Insurance has a quantity limit so she would prefer to just pay cash for it.  Review of Systems     Objective:   Physical Exam  Constitutional: She is oriented to person, place, and time. She appears well-developed and well-nourished.  HENT:  Head: Normocephalic and atraumatic.  Cardiovascular: Normal rate, regular rhythm and normal heart sounds.   Pulmonary/Chest: Effort normal and breath sounds normal.  Neurological: She is alert and oriented to person, place, and time.  Skin: Skin is warm and dry.  Psychiatric: She has a normal mood and affect. Her behavior is normal.       Assessment & Plan:  Diabetes-normally well controlled. Her last A1c was fantastic at 6.5 and she is currently on metformin and an ACE inhibitor. She got scheduled a week early so we will need to wait to do her hemoglobin A1c.  Hypertension-well-controlled. Continue current regimen. Due for lab work.  Insomnia - will send over a 90 day supply.

## 2015-03-05 ENCOUNTER — Other Ambulatory Visit: Payer: Self-pay | Admitting: Family Medicine

## 2015-03-11 LAB — IRON: Iron: 83 ug/dL (ref 45–160)

## 2015-03-11 LAB — COMPLETE METABOLIC PANEL WITH GFR
ALT: 14 U/L (ref 6–29)
AST: 14 U/L (ref 10–35)
Albumin: 4.6 g/dL (ref 3.6–5.1)
Alkaline Phosphatase: 73 U/L (ref 33–130)
BUN: 16 mg/dL (ref 7–25)
CHLORIDE: 98 mmol/L (ref 98–110)
CO2: 28 mmol/L (ref 20–31)
CREATININE: 0.76 mg/dL (ref 0.50–0.99)
Calcium: 10 mg/dL (ref 8.6–10.4)
GFR, EST NON AFRICAN AMERICAN: 85 mL/min (ref >=60)
Glucose, Bld: 104 mg/dL — ABNORMAL HIGH (ref 65–99)
Potassium: 5 mmol/L (ref 3.5–5.3)
Sodium: 139 mmol/L (ref 135–146)
TOTAL PROTEIN: 7.1 g/dL (ref 6.1–8.1)
Total Bilirubin: 0.3 mg/dL (ref 0.2–1.2)

## 2015-03-11 LAB — HEMOGLOBIN A1C
HEMOGLOBIN A1C: 6.4 % — AB (ref <5.7)
Mean Plasma Glucose: 137 mg/dL — ABNORMAL HIGH (ref <117)

## 2015-03-11 LAB — LIPID PANEL
CHOLESTEROL: 181 mg/dL (ref 125–200)
HDL: 68 mg/dL (ref >=46)
LDL Cholesterol: 88 mg/dL (ref <130)
Total CHOL/HDL Ratio: 2.7 Ratio (ref <=5.0)
Triglycerides: 125 mg/dL (ref <150)
VLDL: 25 mg/dL (ref <30)

## 2015-03-11 LAB — FERRITIN: FERRITIN: 34 ng/mL (ref 20–288)

## 2015-04-09 ENCOUNTER — Encounter: Payer: Self-pay | Admitting: Family Medicine

## 2015-04-09 ENCOUNTER — Ambulatory Visit (INDEPENDENT_AMBULATORY_CARE_PROVIDER_SITE_OTHER): Payer: 59 | Admitting: Family Medicine

## 2015-04-09 VITALS — BP 136/65 | HR 86 | Wt 152.0 lb

## 2015-04-09 DIAGNOSIS — R1013 Epigastric pain: Secondary | ICD-10-CM

## 2015-04-09 LAB — COMPLETE METABOLIC PANEL WITH GFR
ALT: 16 U/L (ref 6–29)
AST: 13 U/L (ref 10–35)
Albumin: 4.3 g/dL (ref 3.6–5.1)
Alkaline Phosphatase: 64 U/L (ref 33–130)
BILIRUBIN TOTAL: 0.3 mg/dL (ref 0.2–1.2)
BUN: 19 mg/dL (ref 7–25)
CO2: 27 mmol/L (ref 20–31)
CREATININE: 0.65 mg/dL (ref 0.50–0.99)
Calcium: 9.7 mg/dL (ref 8.6–10.4)
Chloride: 102 mmol/L (ref 98–110)
GFR, Est African American: >89 mL/min (ref >=60)
GFR, Est Non African American: >89 mL/min (ref >=60)
GLUCOSE: 140 mg/dL — AB (ref 65–99)
Potassium: 4.2 mmol/L (ref 3.5–5.3)
SODIUM: 141 mmol/L (ref 135–146)
TOTAL PROTEIN: 6.7 g/dL (ref 6.1–8.1)

## 2015-04-09 LAB — CBC WITH DIFFERENTIAL/PLATELET
BASOS PCT: 0 % (ref 0–1)
Basophils Absolute: 0 10*3/uL (ref 0.0–0.1)
Eosinophils Absolute: 0.2 10*3/uL (ref 0.0–0.7)
Eosinophils Relative: 3 % (ref 0–5)
HCT: 38.9 % (ref 36.0–46.0)
HEMOGLOBIN: 12.9 g/dL (ref 12.0–15.0)
Lymphocytes Relative: 45 % (ref 12–46)
Lymphs Abs: 2.6 10*3/uL (ref 0.7–4.0)
MCH: 29.5 pg (ref 26.0–34.0)
MCHC: 33.2 g/dL (ref 30.0–36.0)
MCV: 88.8 fL (ref 78.0–100.0)
MPV: 8.5 fL — AB (ref 8.6–12.4)
Monocytes Absolute: 0.5 10*3/uL (ref 0.1–1.0)
Monocytes Relative: 9 % (ref 3–12)
NEUTROS ABS: 2.5 10*3/uL (ref 1.7–7.7)
NEUTROS PCT: 43 % (ref 43–77)
Platelets: 324 10*3/uL (ref 150–400)
RBC: 4.38 MIL/uL (ref 3.87–5.11)
RDW: 13.6 % (ref 11.5–15.5)
WBC: 5.7 10*3/uL (ref 4.0–10.5)

## 2015-04-09 LAB — AMYLASE: AMYLASE: 27 U/L (ref 0–105)

## 2015-04-09 LAB — LIPASE: LIPASE: 34 U/L (ref 7–60)

## 2015-04-09 NOTE — Progress Notes (Signed)
   Subjective:    Patient ID: Krista Love, female    DOB: 04/10/1955, 60 y.o.   MRN: [id number]  HPI For 2-3 days she has been getting burning across the upper abdomen.  Notices it more when she lies flat. She has had similar symptoms in the past a few years ago. Not triggered by eating or the metformin.  She said normally she is a little bit more constipated and has more hard stools but her stools actually been moving more normally over the last 2 weeks. There are no diarrhea. No blood in the stool. No nausea or vomiting. No fevers chills or sweats. She said the pain comes in waves. It will last for a few minutes and then eased off with minimal come back a little bit later. She does smoke and drinks a couple cups of coffee daily which does put her at risk of gastric irritation.  Review of Systems     Objective:   Physical Exam  Constitutional: She is oriented to person, place, and time. She appears well-developed and well-nourished.  HENT:  Head: Normocephalic and atraumatic.  Cardiovascular: Normal rate, regular rhythm and normal heart sounds.   Pulmonary/Chest: Effort normal and breath sounds normal.  Abdominal: Soft. Bowel sounds are normal. She exhibits no distension and no mass. There is no tenderness. There is no rebound and no guarding.  Neurological: She is alert and oriented to person, place, and time.  Skin: Skin is warm and dry.  Psychiatric: She has a normal mood and affect. Her behavior is normal.          Assessment & Plan:  Upper abdominal pain-Likely gastritis versus gastric ulcer. Though recommend we go ahead and check for pancreatitis and hepatitis panel check a CBC. Will call with results once available. If these are all negative and reassuring been recommend start a PPI for the next 2-3 weeks to see if it improves her symptoms. We also reviewed a GERD diet to avoid more acidic foods, spicy foods, caffeinated beverages, and overeating.

## 2015-04-09 NOTE — Patient Instructions (Signed)
If labs are okay, then recommend a trial of over-the-counter Prilosec. You would take 1 about 30 minutes before first meal a day for 2-3 weeks. If it's helping then recommend that you complete a full course for one month.

## 2015-05-05 ENCOUNTER — Other Ambulatory Visit: Payer: Self-pay | Admitting: Family Medicine

## 2015-07-30 ENCOUNTER — Ambulatory Visit (INDEPENDENT_AMBULATORY_CARE_PROVIDER_SITE_OTHER): Payer: 59 | Admitting: Family Medicine

## 2015-07-30 ENCOUNTER — Encounter: Payer: Self-pay | Admitting: Family Medicine

## 2015-07-30 VITALS — BP 140/81 | HR 82 | Wt 152.0 lb

## 2015-07-30 DIAGNOSIS — I10 Essential (primary) hypertension: Secondary | ICD-10-CM | POA: Diagnosis not present

## 2015-07-30 DIAGNOSIS — E119 Type 2 diabetes mellitus without complications: Secondary | ICD-10-CM | POA: Diagnosis not present

## 2015-07-30 DIAGNOSIS — G4700 Insomnia, unspecified: Secondary | ICD-10-CM

## 2015-07-30 DIAGNOSIS — R809 Proteinuria, unspecified: Secondary | ICD-10-CM

## 2015-07-30 DIAGNOSIS — E1129 Type 2 diabetes mellitus with other diabetic kidney complication: Secondary | ICD-10-CM | POA: Insufficient documentation

## 2015-07-30 LAB — POCT GLYCOSYLATED HEMOGLOBIN (HGB A1C): HEMOGLOBIN A1C: 6.3

## 2015-07-30 LAB — POCT UA - MICROALBUMIN
CREATININE, POC: 100 mg/dL
MICROALBUMIN (UR) POC: 30 mg/L

## 2015-07-30 MED ORDER — ZOLPIDEM TARTRATE 10 MG PO TABS: 10.0000 mg | ORAL_TABLET | Freq: Every evening | ORAL | Status: DC | PRN

## 2015-07-30 NOTE — Progress Notes (Signed)
Subjective:    CC: DM  HPI: Diabetes - no hypoglycemic events. No wounds or sores that are not healing well. No increased thirst or urination. Checking glucose at home. Taking medications as prescribed without any side effects.  Hypertension- Pt denies chest pain, SOB, dizziness, or heart palpitations.  Taking meds as directed w/o problems.  Denies medication side effects.    Insomnia-currently uses her Ambien every night for the most part. She tolerates it well without any side effects such as increased sedation and grogginess in the morning. No sleepwalking etc.  Past medical history, Surgical history, Family history not pertinant except as noted below, Social history, Allergies, and medications have been entered into the medical record, reviewed, and corrections made.   Review of Systems: No fevers, chills, night sweats, weight loss, chest pain, or shortness of breath.   Objective:    General: Well Developed, well nourished, and in no acute distress.  Neuro: Alert and oriented x3, extra-ocular muscles intact, sensation grossly intact.  HEENT: Normocephalic, atraumatic  Skin: Warm and dry, no rashes. Cardiac: Regular rate and rhythm, no murmurs rubs or gallops, no lower extremity edema.  Respiratory: Clear to auscultation bilaterally. Not using accessory muscles, speaking in full sentences.   Impression and Recommendations:    DM- well controlled. On ACE and statin.  She does take a daily ASA.  Added to med list.   A1C of 6.3 today. Foot and urine micro performed today.   HTN - Well controlled. Continue current regimen. Follow up in 3 months.   Insomnia - Doing well on regimen.

## 2015-07-30 NOTE — Addendum Note (Signed)
Addended by: Deno EtienneBARKLEY, Jerric Oyen L on: 07/30/2015 09:05 AM   Modules accepted: Orders

## 2015-08-16 ENCOUNTER — Other Ambulatory Visit: Payer: Self-pay | Admitting: Family Medicine

## 2015-09-01 ENCOUNTER — Other Ambulatory Visit: Payer: Self-pay | Admitting: Family Medicine

## 2015-09-20 ENCOUNTER — Other Ambulatory Visit: Payer: Self-pay | Admitting: Family Medicine

## 2015-10-13 LAB — HM DIABETES EYE EXAM

## 2015-10-20 ENCOUNTER — Other Ambulatory Visit: Payer: Self-pay | Admitting: Family Medicine

## 2015-10-21 ENCOUNTER — Encounter: Payer: Self-pay | Admitting: Family Medicine

## 2015-10-21 MED ORDER — ZOLPIDEM TARTRATE 10 MG PO TABS: 10.0000 mg | ORAL_TABLET | Freq: Every evening | ORAL | 0 refills | Status: DC | PRN

## 2015-10-22 ENCOUNTER — Other Ambulatory Visit: Payer: Self-pay | Admitting: Family Medicine

## 2015-10-27 ENCOUNTER — Other Ambulatory Visit: Payer: Self-pay | Admitting: Family Medicine

## 2015-12-03 ENCOUNTER — Encounter: Payer: Self-pay | Admitting: Family Medicine

## 2016-01-14 ENCOUNTER — Telehealth: Payer: Self-pay | Admitting: Family Medicine

## 2016-01-14 NOTE — Telephone Encounter (Signed)
I would like to get refill on ambien before 12 27 [facility](mychart request)

## 2016-01-15 ENCOUNTER — Other Ambulatory Visit: Payer: Self-pay | Admitting: *Deleted

## 2016-01-15 MED ORDER — ZOLPIDEM TARTRATE 10 MG PO TABS: 10.0000 mg | ORAL_TABLET | Freq: Every evening | ORAL | 0 refills | Status: DC | PRN

## 2016-01-15 NOTE — Telephone Encounter (Signed)
Refill faxed

## 2016-02-14 ENCOUNTER — Other Ambulatory Visit: Payer: Self-pay | Admitting: Family Medicine

## 2016-02-25 ENCOUNTER — Telehealth: Payer: Self-pay | Admitting: *Deleted

## 2016-02-25 DIAGNOSIS — Z0000 Encounter for general adult medical examination without abnormal findings: Secondary | ICD-10-CM

## 2016-02-25 NOTE — Telephone Encounter (Signed)
lvm informing pt that orders have been faxed.Krista PacasBarkley,  Krista Love

## 2016-02-26 ENCOUNTER — Ambulatory Visit (INDEPENDENT_AMBULATORY_CARE_PROVIDER_SITE_OTHER): Payer: 59 | Admitting: Family Medicine

## 2016-02-26 ENCOUNTER — Encounter: Payer: Self-pay | Admitting: Family Medicine

## 2016-02-26 VITALS — BP 125/71 | HR 91 | Ht 63.0 in | Wt 152.0 lb

## 2016-02-26 DIAGNOSIS — Z0000 Encounter for general adult medical examination without abnormal findings: Secondary | ICD-10-CM

## 2016-02-26 DIAGNOSIS — E119 Type 2 diabetes mellitus without complications: Secondary | ICD-10-CM

## 2016-02-26 DIAGNOSIS — Z1231 Encounter for screening mammogram for malignant neoplasm of breast: Secondary | ICD-10-CM | POA: Diagnosis not present

## 2016-02-26 LAB — COMPLETE METABOLIC PANEL WITH GFR
ALT: 17 U/L (ref 6–29)
AST: 15 U/L (ref 10–35)
Albumin: 4.3 g/dL (ref 3.6–5.1)
Alkaline Phosphatase: 74 U/L (ref 33–130)
BUN: 20 mg/dL (ref 7–25)
CHLORIDE: 104 mmol/L (ref 98–110)
CO2: 26 mmol/L (ref 20–31)
Calcium: 9.4 mg/dL (ref 8.6–10.4)
Creat: 0.74 mg/dL (ref 0.50–0.99)
GFR, EST NON AFRICAN AMERICAN: 87 mL/min (ref >=60)
Glucose, Bld: 112 mg/dL — ABNORMAL HIGH (ref 65–99)
POTASSIUM: 4.3 mmol/L (ref 3.5–5.3)
Sodium: 138 mmol/L (ref 135–146)
Total Bilirubin: 0.3 mg/dL (ref 0.2–1.2)
Total Protein: 6.7 g/dL (ref 6.1–8.1)

## 2016-02-26 LAB — CBC WITH DIFFERENTIAL/PLATELET
BASOS ABS: 0 {cells}/uL (ref 0–200)
Basophils Relative: 0 %
EOS PCT: 4 %
Eosinophils Absolute: 220 cells/uL (ref 15–500)
HCT: 40.1 % (ref 35.0–45.0)
HEMOGLOBIN: 13.2 g/dL (ref 11.7–15.5)
LYMPHS ABS: 2090 {cells}/uL (ref 850–3900)
Lymphocytes Relative: 38 %
MCH: 29.4 pg (ref 27.0–33.0)
MCHC: 32.9 g/dL (ref 32.0–36.0)
MCV: 89.3 fL (ref 80.0–100.0)
MPV: 8.3 fL (ref 7.5–12.5)
Monocytes Absolute: 495 cells/uL (ref 200–950)
Monocytes Relative: 9 %
NEUTROS ABS: 2695 {cells}/uL (ref 1500–7800)
NEUTROS PCT: 49 %
PLATELETS: 280 10*3/uL (ref 140–400)
RBC: 4.49 MIL/uL (ref 3.80–5.10)
RDW: 13.5 % (ref 11.0–15.0)
WBC: 5.5 10*3/uL (ref 3.8–10.8)

## 2016-02-26 LAB — LIPID PANEL W/REFLEX DIRECT LDL
CHOL/HDL RATIO: 2.7 ratio (ref <5.0)
Cholesterol: 176 mg/dL (ref <200)
HDL: 66 mg/dL (ref >50)
LDL-CHOLESTEROL: 87 mg/dL
NON-HDL CHOLESTEROL (CALC): 110 mg/dL (ref <130)
TRIGLYCERIDES: 133 mg/dL (ref <150)

## 2016-02-26 LAB — FERRITIN: FERRITIN: 43 ng/mL (ref 20–288)

## 2016-02-26 LAB — IRON: IRON: 100 ug/dL (ref 45–160)

## 2016-02-26 LAB — POCT GLYCOSYLATED HEMOGLOBIN (HGB A1C): Hemoglobin A1C: 6.4

## 2016-02-26 NOTE — Progress Notes (Addendum)
Subjective:     Krista Love is a 61 y.o. female and is here for a comprehensive physical exam. The patient reports no problems. No regular exercise.    Diabetes - no hypoglycemic events. No wounds or sores that are not healing well. No increased thirst or urination. Checking glucose at home.Sugars around 110-115 with 140 beitn the highest.  Taking medications as prescribed without any side effects.   Social History   Social History  . Marital status: Married    Spouse name: N/A  . Number of children: 1  . Years of education: N/A   Occupational History  . 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 Smoking status: Current Every Day Smoker    Packs/day: 0.50    Types: Cigarettes  . Smokeless tobacco: Never Used  . Alcohol use Yes  . Drug use: No  . Sexual activity: Not Currently   Other Topics Concern  . Not on file   Social History Narrative   No regular exercise. 2 cups of coffee a days.    Health Maintenance  Topic Date Due  . MAMMOGRAM  11/30/2015  . HEMOGLOBIN A1C  01/30/2016  . INFLUENZA VACCINE  08/25/2016 (Originally 08/19/2015)  . TETANUS/TDAP  02/25/2022 (Originally 01/19/2016)  . FOOT EXAM  07/29/2016  . PAP SMEAR  09/12/2016  . OPHTHALMOLOGY EXAM  10/12/2016  . PNEUMOCOCCAL POLYSACCHARIDE VACCINE (2) 11/19/2019  . COLONOSCOPY  10/17/2024  . ZOSTAVAX  Completed  . Hepatitis C Screening  Completed  . HIV Screening  Completed    The following portions of the patient's history were reviewed and updated as appropriate: allergies, current medications, past family history, past medical history, past social history, past surgical history and problem list.  Review of Systems A comprehensive review of systems was negative.   Objective:    BP 125/71   Pulse 91   Ht 5\' 3"  (1.6 m)   Wt 152 lb (68.9 kg)   SpO2 98%   BMI 26.93 kg/m  General appearance: alert, cooperative and appears stated age Head: Normocephalic, without  obvious abnormality, atraumatic Eyes: conj clear, EOMI, PEERLA Ears: normal TM's and external ear canals both ears Nose: Nares normal. Septum midline. Mucosa normal. No drainage or sinus tenderness. Throat: lips, mucosa, and tongue normal; teeth and gums normal Neck: no adenopathy, no carotid bruit, no JVD, supple, symmetrical, trachea midline and thyroid not enlarged, symmetric, no tenderness/mass/nodules Back: symmetric, no curvature. ROM normal. No CVA tenderness. Lungs: clear to auscultation bilaterally Breasts: normal appearance, no masses or tenderness Heart: regular rate and rhythm, S1, S2 normal, no murmur, click, rub or gallop Abdomen: soft, non-tender; bowel sounds normal; no masses,  no organomegaly Extremities: extremities normal, atraumatic, no cyanosis or edema Pulses: 2+ and symmetric Skin: Skin color, texture, turgor normal. No rashes or lesions Lymph nodes: Cervical, supraclavicular, and axillary nodes normal. Neurologic: Alert and oriented X 3, normal strength and tone. Normal symmetric reflexes. Normal coordination and gait    Assessment:    Healthy female exam.      Plan:     See After Visit Summary for Counseling Recommendations   Keep up a regular exercise program and make sure you are eating a healthy diet Try to eat 4 servings of dairy a day, or if you are lactose intolerant take a calcium with vitamin D daily.  Your vaccines are up to date.    DM- 11 A1c of 6.4 today which looks fantastic continue  current regimen and follow-up in 3-4 months.

## 2016-02-28 ENCOUNTER — Other Ambulatory Visit: Payer: Self-pay | Admitting: Family Medicine

## 2016-03-05 ENCOUNTER — Encounter: Payer: Self-pay | Admitting: Family Medicine

## 2016-03-26 ENCOUNTER — Encounter: Payer: Self-pay | Admitting: Family Medicine

## 2016-03-26 ENCOUNTER — Other Ambulatory Visit: Payer: Self-pay

## 2016-03-26 MED ORDER — ROSUVASTATIN CALCIUM 40 MG PO TABS: 40.0000 mg | ORAL_TABLET | Freq: Every day | ORAL | 1 refills | Status: DC

## 2016-03-26 MED ORDER — LISINOPRIL 20 MG PO TABS: 20.0000 mg | ORAL_TABLET | Freq: Every day | ORAL | 1 refills | Status: DC

## 2016-04-01 ENCOUNTER — Telehealth: Payer: Self-pay | Admitting: *Deleted

## 2016-04-01 NOTE — Telephone Encounter (Signed)
[facility]

## 2016-04-07 NOTE — Telephone Encounter (Signed)
Fortamet has been approved through insurance from 04/01/2016-04/02/2019. Approval faxed to [facility]

## 2016-04-14 ENCOUNTER — Other Ambulatory Visit: Payer: Self-pay | Admitting: *Deleted

## 2016-04-14 MED ORDER — ZOLPIDEM TARTRATE 10 MG PO TABS: 10.0000 mg | ORAL_TABLET | Freq: Every evening | ORAL | 0 refills | Status: DC | PRN

## 2016-07-12 ENCOUNTER — Other Ambulatory Visit: Payer: Self-pay | Admitting: Family Medicine

## 2016-07-29 LAB — HM MAMMOGRAPHY

## 2016-08-05 ENCOUNTER — Encounter: Payer: Self-pay | Admitting: Family Medicine

## 2016-08-27 ENCOUNTER — Ambulatory Visit: Payer: Self-pay | Admitting: Family Medicine

## 2016-09-11 ENCOUNTER — Other Ambulatory Visit: Payer: Self-pay | Admitting: Family Medicine

## 2016-09-15 ENCOUNTER — Ambulatory Visit: Payer: 59 | Admitting: Family Medicine

## 2016-09-15 DIAGNOSIS — Z0189 Encounter for other specified special examinations: Secondary | ICD-10-CM

## 2016-09-16 ENCOUNTER — Other Ambulatory Visit: Payer: Self-pay | Admitting: Family Medicine

## 2016-09-16 ENCOUNTER — Encounter: Payer: Self-pay | Admitting: Family Medicine

## 2016-09-16 ENCOUNTER — Ambulatory Visit (INDEPENDENT_AMBULATORY_CARE_PROVIDER_SITE_OTHER): Payer: 59 | Admitting: Family Medicine

## 2016-09-16 VITALS — BP 136/75 | HR 91 | Wt 151.0 lb

## 2016-09-16 DIAGNOSIS — F341 Dysthymic disorder: Secondary | ICD-10-CM

## 2016-09-16 DIAGNOSIS — E119 Type 2 diabetes mellitus without complications: Secondary | ICD-10-CM

## 2016-09-16 DIAGNOSIS — G5603 Carpal tunnel syndrome, bilateral upper limbs: Secondary | ICD-10-CM | POA: Diagnosis not present

## 2016-09-16 DIAGNOSIS — I10 Essential (primary) hypertension: Secondary | ICD-10-CM | POA: Diagnosis not present

## 2016-09-16 LAB — POCT UA - MICROALBUMIN
Albumin/Creatinine Ratio, Urine, POC: <30
CREATININE, POC: 200 mg/dL
MICROALBUMIN (UR) POC: 30 mg/L

## 2016-09-16 LAB — POCT GLYCOSYLATED HEMOGLOBIN (HGB A1C): Hemoglobin A1C: 6.6

## 2016-09-16 NOTE — Progress Notes (Signed)
Subjective:    CC: DM, HTN, Mood  HPI:  Diabetes - no hypoglycemic events. No wounds or sores that are not healing well. No increased thirst or urination. Checking glucose at home. Taking medications as prescribed without any side effects.home blood sugars running less than 130.  Hypertension- Pt denies chest pain, SOB, dizziness, or heart palpitations.  Taking meds as directed w/o problems.  Denies medication side effects.    Anxiety/Depression - she is doing well overall.  Happy with current medication regimen.    She reports that she had bilateral carpal tunnel surgery about 7 years ago and has done really well but more recently she's been waking up at night withde of her hands going numb. This feels just like when she was experiencing symptoms from her carpal tunnel. She says as soon as she straightens her wrist out it seems to get relief pre-quickly. It's not been bothering her through the day. 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  Past medical history, Surgical history, Family history not pertinant except as noted below, Social history, Allergies, and medications have been entered into the medical record, reviewed, and corrections made.   Review of Systems: No fevers, chills, night sweats, weight loss, chest pain, or shortness of breath.   Objective:    General: Well Developed, well nourished, and in no acute distress.  Neuro: Alert and oriented x3, extra-ocular muscles intact, sensation grossly intact.  HEENT: Normocephalic, atraumatic  Skin: Warm and dry, no rashes. Cardiac: Regular rate and rhythm, no murmurs rubs or gallops, no lower extremity edema.  Respiratory: Clear to auscultation bilaterally. Not using accessory muscles, speaking in full sentences.   Impression and Recommendations:   DM- overall well controlled. Hemoglobin A1c up a little bit from previous. Currently 6.6.  HTN - Well controlled. Continue current regimen. Follow up in  4 months.   Depression/Anxiety - PHQ  9 negative. We'll continue venlafaxine. Follow-up in 6 months.  BiLateral carpal tunnel-right now her symptoms are mostly just bothering her at night and she gets relief when she extends the wrist. We'll go ahead and fit her with bilateral cockup splints. If not improving over the next 3-4 weeks and please let us know.  Encouraged her to schedule her pap smear.

## 2016-09-17 LAB — BASIC METABOLIC PANEL WITH GFR
BUN: 14 mg/dL (ref 7–25)
CHLORIDE: 102 mmol/L (ref 98–110)
CO2: 24 mmol/L (ref 20–32)
Calcium: 9.7 mg/dL (ref 8.6–10.4)
Creat: 0.82 mg/dL (ref 0.50–0.99)
GFR, EST NON AFRICAN AMERICAN: 76 mL/min (ref >=60)
GFR, Est African American: 88 mL/min (ref >=60)
Glucose, Bld: 121 mg/dL — ABNORMAL HIGH (ref 65–99)
POTASSIUM: 4.2 mmol/L (ref 3.5–5.3)
Sodium: 140 mmol/L (ref 135–146)

## 2016-09-17 NOTE — Progress Notes (Signed)
All labs are normal. 

## 2016-09-23 ENCOUNTER — Other Ambulatory Visit: Payer: Self-pay | Admitting: Family Medicine

## 2016-09-23 MED ORDER — ZOLPIDEM TARTRATE 10 MG PO TABS: 10.0000 mg | ORAL_TABLET | Freq: Every day | ORAL | 0 refills | Status: DC

## 2016-09-23 MED ORDER — METFORMIN HCL ER (OSM) 1000 MG PO TB24: 1000.0000 mg | ORAL_TABLET | Freq: Every day | ORAL | 2 refills | Status: DC

## 2016-09-28 ENCOUNTER — Other Ambulatory Visit: Payer: Self-pay | Admitting: *Deleted

## 2016-09-28 MED ORDER — ROSUVASTATIN CALCIUM 40 MG PO TABS: 40.0000 mg | ORAL_TABLET | Freq: Every day | ORAL | 1 refills | Status: DC

## 2016-10-03 ENCOUNTER — Other Ambulatory Visit: Payer: Self-pay | Admitting: Family Medicine

## 2016-10-14 LAB — HM DIABETES EYE EXAM

## 2016-12-10 ENCOUNTER — Other Ambulatory Visit: Payer: Self-pay | Admitting: Family Medicine

## 2016-12-27 ENCOUNTER — Other Ambulatory Visit: Payer: Self-pay | Admitting: Family Medicine

## 2016-12-27 DIAGNOSIS — G4700 Insomnia, unspecified: Secondary | ICD-10-CM

## 2016-12-31 ENCOUNTER — Encounter: Payer: Self-pay | Admitting: Family Medicine

## 2017-01-03 ENCOUNTER — Other Ambulatory Visit: Payer: Self-pay | Admitting: Family Medicine

## 2017-01-03 DIAGNOSIS — G4700 Insomnia, unspecified: Secondary | ICD-10-CM

## 2017-01-13 ENCOUNTER — Ambulatory Visit: Payer: 59 | Admitting: Family Medicine

## 2017-01-17 ENCOUNTER — Ambulatory Visit (INDEPENDENT_AMBULATORY_CARE_PROVIDER_SITE_OTHER): Payer: 59 | Admitting: Family Medicine

## 2017-01-17 ENCOUNTER — Encounter: Payer: Self-pay | Admitting: Family Medicine

## 2017-01-17 VITALS — BP 135/73 | HR 88 | Ht 63.0 in | Wt 149.0 lb

## 2017-01-17 DIAGNOSIS — I10 Essential (primary) hypertension: Secondary | ICD-10-CM | POA: Diagnosis not present

## 2017-01-17 DIAGNOSIS — E119 Type 2 diabetes mellitus without complications: Secondary | ICD-10-CM

## 2017-01-17 LAB — POCT GLYCOSYLATED HEMOGLOBIN (HGB A1C): Hemoglobin A1C: 6.6

## 2017-01-17 NOTE — Progress Notes (Signed)
Subjective:    CC: DM, BP  HPI: Diabetes - no hypoglycemic events. No wounds or sores that are not healing well. No increased thirst or urination. Checking glucose at home. Taking medications as prescribed without any side effects.  Hypertension- Pt denies chest pain, SOB, dizziness, or heart palpitations.  Taking meds as directed w/o problems.  Denies medication side effects.     Past medical history, Surgical history, Family history not pertinant except as noted below, Social history, Allergies, and medications have been entered into the medical record, reviewed, and corrections made.   Review of Systems: No fevers, chills, night sweats, weight loss, chest pain, or shortness of breath.   Objective:    General: Well Developed, well nourished, and in no acute distress.  Neuro: Alert and oriented x3, extra-ocular muscles intact, sensation grossly intact.  HEENT: Normocephalic, atraumatic  Skin: Warm and dry, no rashes. Cardiac: Regular rate and rhythm, no murmurs rubs or gallops, no lower extremity edema.  Respiratory: Clear to auscultation bilaterally. Not using accessory muscles, speaking in full sentences.   Impression and Recommendations:    DM -stable.  Hemoglobin A1c is 6.6 today which is fantastic.  She says she just got a fit bit for Christmas and plans on starting to exercise more regularly.    HTN  - Well controlled. Continue current regimen. Follow up in  4 months.   She declines flu vaccine and tetanus vaccine today.  She plans on scheduling her physical in January with her Pap smear and we will do blood work at that time.

## 2017-02-08 ENCOUNTER — Ambulatory Visit (INDEPENDENT_AMBULATORY_CARE_PROVIDER_SITE_OTHER): Payer: 59 | Admitting: Family Medicine

## 2017-02-08 ENCOUNTER — Other Ambulatory Visit (HOSPITAL_COMMUNITY)
Admission: RE | Admit: 2017-02-08 | Discharge: 2017-02-08 | Disposition: A | Discharge status: Home / Self Care | Payer: 59 | Source: Ambulatory Visit | Attending: Family Medicine | Admitting: Family Medicine

## 2017-02-08 ENCOUNTER — Other Ambulatory Visit: Payer: Self-pay | Admitting: Family Medicine

## 2017-02-08 ENCOUNTER — Encounter: Payer: Self-pay | Admitting: Family Medicine

## 2017-02-08 VITALS — BP 137/72 | HR 97 | Ht 63.0 in | Wt 149.0 lb

## 2017-02-08 DIAGNOSIS — N898 Other specified noninflammatory disorders of vagina: Secondary | ICD-10-CM | POA: Diagnosis not present

## 2017-02-08 DIAGNOSIS — Z0000 Encounter for general adult medical examination without abnormal findings: Secondary | ICD-10-CM | POA: Diagnosis not present

## 2017-02-08 DIAGNOSIS — Z124 Encounter for screening for malignant neoplasm of cervix: Secondary | ICD-10-CM | POA: Diagnosis not present

## 2017-02-08 DIAGNOSIS — Z23 Encounter for immunization: Secondary | ICD-10-CM | POA: Diagnosis not present

## 2017-02-08 LAB — WET PREP FOR TRICH, YEAST, CLUE
MICRO NUMBER: 90090103
Specimen Quality: ADEQUATE

## 2017-02-08 MED ORDER — FLUCONAZOLE 150 MG PO TABS
150.0000 mg | ORAL_TABLET | Freq: Once | ORAL | 0 refills | Status: DC
Start: 2017-02-08 — End: 2017-03-11

## 2017-02-08 NOTE — Progress Notes (Signed)
Subjective:     Krista Love is a 62 y.o. female and is here for a comprehensive physical exam. The patient reports no problems.  Social History   Socioeconomic History  . Marital status: Married    Spouse name: Not on file  . Number of children: 1  . Years of education: Not on file  . Highest education level: Not on file  Social Needs  . Financial resource strain: Not on file  . Food insecurity - worry: Not on file  . Food insecurity - inability: Not on file  . Transportation needs - medical: Not on file  . Transportation needs - non-medical: Not on file  Occupational History  . Occupation: Airline pilotALES REPORTING [employer]    Employer: [employer]  Tobacco Use  . Smoking status: Current Every Day Smoker    Packs/day: 0.50    Types: Cigarettes  . Smokeless tobacco: Never Used  Substance and Sexual Activity  . Alcohol use: Yes  . Drug use: No  . Sexual activity: Not Currently  Other Topics Concern  . Not on file  Social History Narrative   No regular exercise. 2 cups of coffee a days.    Health Maintenance  Topic Date Due  . PAP SMEAR  09/12/2016  . INFLUENZA VACCINE  09/16/2017 (Originally 08/18/2016)  . TETANUS/TDAP  02/25/2022 (Originally 01/19/2016)  . HEMOGLOBIN A1C  07/17/2017  . FOOT EXAM  09/16/2017  . OPHTHALMOLOGY EXAM  10/14/2017  . MAMMOGRAM  07/30/2018  . PNEUMOCOCCAL POLYSACCHARIDE VACCINE (2) 11/19/2019  . COLONOSCOPY  10/17/2024  . Hepatitis C Screening  Completed  . HIV Screening  Completed    The following portions of the patient's history were reviewed and updated as appropriate: allergies, current medications, past family history, past medical history, past social history, past surgical history and problem list.  Review of Systems A comprehensive review of systems was negative.   Objective:    BP 137/72   Pulse 97   Ht 5\' 3"  (1.6 m)   Wt 149 lb (67.6 kg)   SpO2 99%   BMI 26.39 kg/m  General appearance: alert, cooperative and appears  stated age Head: Normocephalic, without obvious abnormality, atraumatic Eyes: conj clear, EOMI, PEERLA Ears: normal TM's and external ear canals both ears Nose: Nares normal. Septum midline. Mucosa normal. No drainage or sinus tenderness. Throat: lips, mucosa, and tongue normal; teeth and gums normal Neck: no adenopathy, no carotid bruit, no JVD, supple, symmetrical, trachea midline and thyroid not enlarged, symmetric, no tenderness/mass/nodules Back: symmetric, no curvature. ROM normal. No CVA tenderness. Lungs: clear to auscultation bilaterally Breasts: normal appearance, no masses or tenderness Heart: regular rate and rhythm, S1, S2 normal, no murmur, click, rub or gallop Abdomen: soft, non-tender; bowel sounds normal; no masses,  no organomegaly Pelvic: cervix normal in appearance, external genitalia normal, no adnexal masses or tenderness, no cervical motion tenderness, rectovaginal septum normal, uterus normal size, shape, and consistency and vagina normal without discharge Extremities: extremities normal, atraumatic, no cyanosis or edema Pulses: 2+ and symmetric Skin: Skin color, texture, turgor normal. No rashes or lesions Lymph nodes: Cervical adenopathy: nl, Axillary adenopathy: nl and Supraclavicular adenopathy: nl Neurologic: Alert and oriented X 3, normal strength and tone. Normal symmetric reflexes. Normal coordination and gait    Assessment:    Healthy female exam.      Plan:     See After Visit Summary for Counseling Recommendations   Keep up a regular exercise program and make sure you  are eating a healthy diet.   Try to eat 4 servings of dairy a day, or if you are lactose intolerant take a calcium with vitamin D daily.  Your vaccines are up to date.   Tdap given today.

## 2017-02-08 NOTE — Patient Instructions (Addendum)

## 2017-02-11 LAB — CYTOLOGY - PAP
DIAGNOSIS: NEGATIVE
HPV: NOT DETECTED

## 2017-02-11 NOTE — Progress Notes (Signed)
Call patient: Your Pap smear is normal. Repeat in 5 years.

## 2017-02-28 LAB — COMPLETE METABOLIC PANEL WITH GFR
AG Ratio: 1.8 (calc) (ref 1.0–2.5)
ALKALINE PHOSPHATASE (APISO): 83 U/L (ref 33–130)
ALT: 16 U/L (ref 6–29)
AST: 13 U/L (ref 10–35)
Albumin: 4.2 g/dL (ref 3.6–5.1)
BILIRUBIN TOTAL: 0.3 mg/dL (ref 0.2–1.2)
BUN: 18 mg/dL (ref 7–25)
CHLORIDE: 104 mmol/L (ref 98–110)
CO2: 28 mmol/L (ref 20–32)
Calcium: 9.3 mg/dL (ref 8.6–10.4)
Creat: 0.65 mg/dL (ref 0.50–0.99)
GFR, EST AFRICAN AMERICAN: 110 mL/min/{1.73_m2} (ref >=60)
GFR, Est Non African American: 94 mL/min/{1.73_m2} (ref >=60)
GLUCOSE: 99 mg/dL (ref 65–99)
Globulin: 2.3 g/dL (calc) (ref 1.9–3.7)
Potassium: 4.3 mmol/L (ref 3.5–5.3)
Sodium: 139 mmol/L (ref 135–146)
Total Protein: 6.5 g/dL (ref 6.1–8.1)

## 2017-02-28 LAB — LIPID PANEL
CHOLESTEROL: 178 mg/dL (ref <200)
HDL: 63 mg/dL (ref >50)
LDL CHOLESTEROL (CALC): 87 mg/dL
Non-HDL Cholesterol (Calc): 115 mg/dL (calc) (ref <130)
TRIGLYCERIDES: 186 mg/dL — AB (ref <150)
Total CHOL/HDL Ratio: 2.8 (calc) (ref <5.0)

## 2017-02-28 LAB — CBC
HCT: 39.2 % (ref 35.0–45.0)
HEMOGLOBIN: 13.4 g/dL (ref 11.7–15.5)
MCH: 30.4 pg (ref 27.0–33.0)
MCHC: 34.2 g/dL (ref 32.0–36.0)
MCV: 88.9 fL (ref 80.0–100.0)
MPV: 9 fL (ref 7.5–12.5)
Platelets: 315 10*3/uL (ref 140–400)
RBC: 4.41 10*6/uL (ref 3.80–5.10)
RDW: 12.7 % (ref 11.0–15.0)
WBC: 7.9 10*3/uL (ref 3.8–10.8)

## 2017-03-10 ENCOUNTER — Encounter: Payer: Self-pay | Admitting: Family Medicine

## 2017-03-10 ENCOUNTER — Other Ambulatory Visit: Payer: Self-pay | Admitting: *Deleted

## 2017-03-10 DIAGNOSIS — I10 Essential (primary) hypertension: Secondary | ICD-10-CM

## 2017-03-10 DIAGNOSIS — E785 Hyperlipidemia, unspecified: Secondary | ICD-10-CM

## 2017-03-10 MED ORDER — LISINOPRIL 20 MG PO TABS: 20.0000 mg | ORAL_TABLET | Freq: Every day | ORAL | 2 refills | Status: DC

## 2017-03-10 MED ORDER — ROSUVASTATIN CALCIUM 40 MG PO TABS: 40.0000 mg | ORAL_TABLET | Freq: Every day | ORAL | 3 refills | Status: DC

## 2017-03-11 MED ORDER — FLUCONAZOLE 150 MG PO TABS: 150.0000 mg | ORAL_TABLET | Freq: Once | ORAL | 1 refills | Status: AC

## 2017-03-16 ENCOUNTER — Encounter: Payer: Self-pay | Admitting: Family Medicine

## 2017-04-02 ENCOUNTER — Other Ambulatory Visit: Payer: Self-pay | Admitting: Family Medicine

## 2017-04-02 DIAGNOSIS — G4700 Insomnia, unspecified: Secondary | ICD-10-CM

## 2017-04-04 NOTE — Telephone Encounter (Signed)
Sent to pcp for signature.Kaybree Williams [name], CMA  

## 2017-04-13 ENCOUNTER — Ambulatory Visit: Payer: 59 | Admitting: Family Medicine

## 2017-04-19 ENCOUNTER — Encounter: Payer: Self-pay | Admitting: Family Medicine

## 2017-04-19 ENCOUNTER — Ambulatory Visit (INDEPENDENT_AMBULATORY_CARE_PROVIDER_SITE_OTHER): Payer: 59 | Admitting: Family Medicine

## 2017-04-19 VITALS — BP 132/78 | HR 84 | Ht 63.0 in | Wt 147.0 lb

## 2017-04-19 DIAGNOSIS — E119 Type 2 diabetes mellitus without complications: Secondary | ICD-10-CM

## 2017-04-19 DIAGNOSIS — I10 Essential (primary) hypertension: Secondary | ICD-10-CM | POA: Diagnosis not present

## 2017-04-19 DIAGNOSIS — G4700 Insomnia, unspecified: Secondary | ICD-10-CM | POA: Diagnosis not present

## 2017-04-19 DIAGNOSIS — F341 Dysthymic disorder: Secondary | ICD-10-CM | POA: Diagnosis not present

## 2017-04-19 LAB — POCT GLYCOSYLATED HEMOGLOBIN (HGB A1C): Hemoglobin A1C: 6.6

## 2017-04-19 NOTE — Progress Notes (Signed)
Subjective:    CC: DM ck  HPI:  Hypertension- Pt denies chest pain, SOB, dizziness, or heart palpitations.  Taking meds as directed w/o problems.  Denies medication side effects.    Diabetes - no hypoglycemic events. No wounds or sores that are not healing well. No increased thirst or urination. Checking glucose at home. Taking medications as prescribed without any side effects.  6 mo f/u Anxiety/Depression - she is doing well overall.  Happy with current medication regimen  F/U insomnia - Using ambien for sleep and tolerating well.  NO side effects on the medication.     Past medical history, Surgical history, Family history not pertinant except as noted below, Social history, Allergies, and medications have been entered into the medical record, reviewed, and corrections made.   Review of Systems: No fevers, chills, night sweats, weight loss, chest pain, or shortness of breath.   Objective:    General: Well Developed, well nourished, and in no acute distress.  Neuro: Alert and oriented x3, extra-ocular muscles intact, sensation grossly intact.  HEENT: Normocephalic, atraumatic  Skin: Warm and dry, no rashes. Cardiac: Regular rate and rhythm, no murmurs rubs or gallops, no lower extremity edema.  Respiratory: Clear to auscultation bilaterally. Not using accessory muscles, speaking in full sentences.   Impression and Recommendations:    HTN - Well controlled. Continue current regimen. Follow up in  4 months.    DM - Well controlled. Continue current regimen. Follow up in  4 months.  Lab Results  Component Value Date   HGBA1C 6.6 04/19/2017   Anxiety/Depression - Well controlled. Continue current regimen. Follow up in  6 months.   Insomnia - Well controlled. Continue current regimen. Follow up in  6months.

## 2017-04-25 ENCOUNTER — Other Ambulatory Visit: Payer: Self-pay

## 2017-04-25 ENCOUNTER — Encounter: Payer: Self-pay | Admitting: Family Medicine

## 2017-04-25 MED ORDER — METFORMIN HCL 1000 MG PO TABS: 1000.0000 mg | ORAL_TABLET | Freq: Two times a day (BID) | ORAL | 0 refills | Status: DC

## 2017-05-11 ENCOUNTER — Encounter: Payer: Self-pay | Admitting: Family Medicine

## 2017-05-19 ENCOUNTER — Other Ambulatory Visit: Payer: Self-pay | Admitting: Family Medicine

## 2017-05-23 ENCOUNTER — Other Ambulatory Visit: Payer: Self-pay | Admitting: Family Medicine

## 2017-05-30 ENCOUNTER — Ambulatory Visit (INDEPENDENT_AMBULATORY_CARE_PROVIDER_SITE_OTHER): Payer: 59 | Admitting: Family Medicine

## 2017-05-30 ENCOUNTER — Encounter: Payer: Self-pay | Admitting: Family Medicine

## 2017-05-30 VITALS — BP 135/75 | HR 65 | Ht 63.0 in | Wt 149.0 lb

## 2017-05-30 DIAGNOSIS — N760 Acute vaginitis: Secondary | ICD-10-CM | POA: Diagnosis not present

## 2017-05-30 DIAGNOSIS — R319 Hematuria, unspecified: Secondary | ICD-10-CM | POA: Diagnosis not present

## 2017-05-30 DIAGNOSIS — R1030 Lower abdominal pain, unspecified: Secondary | ICD-10-CM | POA: Diagnosis not present

## 2017-05-30 LAB — POCT URINALYSIS DIPSTICK
Bilirubin, UA: NEGATIVE
GLUCOSE UA: NEGATIVE
Ketones, UA: NEGATIVE
LEUKOCYTES UA: NEGATIVE
NITRITE UA: NEGATIVE
Protein, UA: NEGATIVE
Urobilinogen, UA: 0.2 E.U./dL
pH, UA: 5.5 (ref 5.0–8.0)

## 2017-05-30 LAB — WET PREP FOR TRICH, YEAST, CLUE
MICRO NUMBER:: 90579591
Specimen Quality: ADEQUATE

## 2017-05-30 LAB — NO SPECIMEN RECEIVED: A SOURCE 12: 8563

## 2017-05-30 NOTE — Addendum Note (Signed)
Addended by: Deno Etienne on: 05/30/2017 02:12 PM   Modules accepted: Orders

## 2017-05-30 NOTE — Patient Instructions (Signed)
For the urine sample, give the first morning urine. Do not wipe clean before. Just pee straight into the cup.  Can use Ibuprofen on Aleve as needed for the pain.

## 2017-05-30 NOTE — Progress Notes (Signed)
Subjective:    Patient ID: Krista Love, female    DOB: 1955-03-31, 62 y.o.   MRN: [id number]  HPI 62 year old female comes in today complaining of back pain and pelvic cramping.  She has not been sexually active for about 7 years until about a week ago.  She did have unprotected sex with someone that she trusted and has known for about 20 years.  But then this weekend on Saturday afternoon started noticing some bilateral low back pain.  Then yesterday noticed some pelvic cramping. Says it was bad enough stayed in bed most of the day.  Has felt hot at time.   Some slight vaginal itching.  She denies any actual dysuria.  She says her bowels are moving normally and she has not had any constipation.  She does feel a little bit better today and has only had cramping about 3 times.  She has not had any vaginal bleeding or spotting or abnormal discharge.  Smear is up-to-date and just had one done in January.     Review of Systems  BP 135/75   Pulse 65   Ht  (1.6 m)   Wt 149 lb (67.6 kg)   SpO2 100%   BMI 26.39 kg/m     Allergies  Allergen Reactions  . Codeine     Past Medical History:  Diagnosis Date  . Diabetes mellitus   . Hyperlipidemia   . Hypertension     Past Surgical History:  Procedure Laterality Date  . back surgery for a ruptured disc      Social History   Socioeconomic History  . Marital status: Married    Spouse name: Not on file  . Number of children: 1  . Years of education: Not on file  . Highest education level: Not on file  Occupational History  . Occupation: Airline pilot REPORTING [employer]    Employer: [employer]  Social Needs  . Financial resource strain: Not on file  . Food insecurity:    Worry: Not on file    Inability: Not on file  . Transportation needs:    Medical: Not on file    Non-medical: Not on file  Tobacco Use  . Smoking status: Current Every Day Smoker    Packs/day: 0.50    Types: Cigarettes  . Smokeless tobacco: Never Used   Substance and Sexual Activity  . Alcohol use: Yes  . Drug use: No  . Sexual activity: Not Currently  Lifestyle  . Physical activity:    Days per week: Not on file    Minutes per session: Not on file  . Stress: Not on file  Relationships  . Social connections:    Talks on phone: Not on file    Gets together: Not on file    Attends religious service: Not on file    Active member of club or organization: Not on file    Attends meetings of clubs or organizations: Not on file    Relationship status: Not on file  . Intimate partner violence:    Fear of current or ex partner: Not on file    Emotionally abused: Not on file    Physically abused: Not on file    Forced sexual activity: Not on file  Other Topics Concern  . Not on file  Social History Narrative   No regular exercise. 2 cups of coffee a days.     Family History  Problem Relation Age of Onset  .  Hypertension Mother   . Diabetes Brother   . Cancer Maternal Grandfather        skin  . Stomach cancer Sister   . Heart disease Sister 82       stents.     Outpatient Encounter Medications as of 05/30/2017  Medication Sig  . aspirin EC 81 MG tablet Take 81 mg by mouth daily.  Marland Kitchen glucose blood test strip One touch ultra test strip. DX: 250.00. Use to test once daily.  Marland Kitchen lisinopril (PRINIVIL,ZESTRIL) 20 MG tablet Take 1 tablet (20 mg total) by mouth daily.  . metformin (FORTAMET) 1000 MG (OSM) 24 hr tablet Take 1 tablet (1,000 mg total) by mouth daily.  . metFORMIN (GLUCOPHAGE) 1000 MG tablet TAKE 1 TABLET (1,000 MG TOTAL) BY MOUTH 2 (TWO) TIMES DAILY WITH A MEAL.  . NON FORMULARY CBD Oil  . ONETOUCH DELICA LANCETS MISC by Does not apply route. Delica Lancets and test strips- DX: 250.00 test once daily   . rosuvastatin (CRESTOR) 40 MG tablet Take 1 tablet (40 mg total) by mouth daily.  Marland Kitchen venlafaxine XR (EFFEXOR-XR) 75 MG 24 hr capsule TAKE 1 CAPSULE DAILY  . zolpidem (AMBIEN) 10 MG tablet TAKE 1 TABLET BY MOUTH EVERYDAY AT  BEDTIME   No facility-administered encounter medications on file as of 05/30/2017.          Objective:   Physical Exam  Constitutional: She is oriented to person, place, and time. She appears well-developed and well-nourished.  HENT:  Head: Normocephalic and atraumatic.  Eyes: Conjunctivae and EOM are normal.  Cardiovascular: Normal rate, regular rhythm and normal heart sounds.  Pulmonary/Chest: Effort normal and breath sounds normal.  Abdominal: Soft. Normal appearance and bowel sounds are normal. She exhibits no mass. There is no tenderness.  Neurological: She is alert and oriented to person, place, and time.  Skin: Skin is warm and dry. No pallor.  Psychiatric: She has a normal mood and affect. Her behavior is normal.  Vitals reviewed.      Assessment & Plan:  Pelvic cramping/Low back pain.  Unclear etiology but explained to her that we rule things out in a stepwise process.  Urinalysis dipstick was negative except for some microscopic blood so we will send for culture and microscopic review.  Also had a perform a wet prep and we should have that back later today or first thing in the morning.  Also sent her home with a urine cup to do a urine GC chlamydia since she is newly sexually active.  She will collect a first morning urine.  She has not had any bowel problems.  Pap smear is up-to-date from January of this year.  And if she is not improving her pain persist then we will schedule her for pelvic ultrasound at the end of the week.

## 2017-05-31 LAB — URINE CULTURE
MICRO NUMBER:: 90580661
Result:: NO GROWTH
SPECIMEN QUALITY:: ADEQUATE

## 2017-05-31 LAB — URINALYSIS, MICROSCOPIC ONLY
Bacteria, UA: NONE SEEN /HPF
Hyaline Cast: NONE SEEN /LPF
RBC / HPF: NONE SEEN /HPF (ref 0–2)
Squamous Epithelial / LPF: NONE SEEN /HPF (ref <=5)
WBC UA: NONE SEEN /HPF (ref 0–5)

## 2017-06-03 LAB — C. TRACHOMATIS/N. GONORRHOEAE RNA
C. TRACHOMATIS RNA, TMA: NOT DETECTED
N. gonorrhoeae RNA, TMA: NOT DETECTED

## 2017-06-29 ENCOUNTER — Other Ambulatory Visit: Payer: Self-pay | Admitting: Family Medicine

## 2017-06-29 DIAGNOSIS — G4700 Insomnia, unspecified: Secondary | ICD-10-CM

## 2017-06-29 NOTE — Telephone Encounter (Signed)
[facility] requesting RF on pt's Zolpidem.  Last written 04-04-17 for #90.   RX pended, please review and send if appropriate.  Thanks!

## 2017-08-18 ENCOUNTER — Encounter: Payer: Self-pay | Admitting: Physician Assistant

## 2017-08-18 ENCOUNTER — Ambulatory Visit (INDEPENDENT_AMBULATORY_CARE_PROVIDER_SITE_OTHER): Payer: 59 | Admitting: Physician Assistant

## 2017-08-18 VITALS — BP 129/74 | HR 89 | Temp 98.1°F | Wt 148.0 lb

## 2017-08-18 DIAGNOSIS — R82998 Other abnormal findings in urine: Secondary | ICD-10-CM

## 2017-08-18 DIAGNOSIS — R300 Dysuria: Secondary | ICD-10-CM

## 2017-08-18 LAB — POCT URINALYSIS DIPSTICK
Bilirubin, UA: NEGATIVE
Glucose, UA: NEGATIVE
Nitrite, UA: POSITIVE
Protein, UA: POSITIVE — AB
Spec Grav, UA: >=1.03 — AB (ref 1.010–1.025)
Urobilinogen, UA: 0.2 E.U./dL
pH, UA: 6.5 (ref 5.0–8.0)

## 2017-08-18 MED ORDER — CEFUROXIME AXETIL 250 MG PO TABS: 250.0000 mg | ORAL_TABLET | Freq: Two times a day (BID) | ORAL | 0 refills | Status: AC

## 2017-08-18 MED ORDER — PHENAZOPYRIDINE HCL 100 MG PO TABS: 100.0000 mg | ORAL_TABLET | Freq: Three times a day (TID) | ORAL | 0 refills | Status: DC | PRN

## 2017-08-18 NOTE — Patient Instructions (Signed)

## 2017-08-18 NOTE — Progress Notes (Signed)
HPI:                                                                Krista Love is a 62 y.o. female who [facility] today for dysuria  Dysuria   This is a new problem. The current episode started in the past 7 days. The problem occurs every urination. The problem has been unchanged. The quality of the pain is described as burning. The pain is mild. There has been no fever. She is not sexually active. There is no history of pyelonephritis. Associated symptoms include urgency. Pertinent negatives include no chills, discharge, flank pain or hematuria. Associated symptoms comments: + suprapubic pressure. She has tried nothing for the symptoms. There is no history of kidney stones, recurrent UTIs or a urological procedure.      Past Medical History:  Diagnosis Date  . Diabetes mellitus   . Hyperlipidemia   . Hypertension    Past Surgical History:  Procedure Laterality Date  . back surgery for a ruptured disc     Social History   Tobacco Use  . Smoking status: Current Every Day Smoker    Packs/day: 0.50    Types: Cigarettes  . Smokeless tobacco: Never Used  Substance Use Topics  . Alcohol use: Yes   family history includes Cancer in her maternal grandfather; Diabetes in her brother; Heart disease (age of onset: 8965) in her sister; Hypertension in her mother; Stomach cancer in her sister.    ROS: negative except as noted in the HPI  Medications: Current Outpatient Medications  Medication Sig Dispense Refill  . aspirin EC 81 MG tablet Take 81 mg by mouth daily.    . cefUROXime (CEFTIN) 250 MG tablet Take 1 tablet (250 mg total) by mouth 2 (two) times daily with a meal for 7 days. 14 tablet 0  . glucose blood test strip One touch ultra test strip. DX: 250.00. Use to test once daily. 100 each 12  . lisinopril (PRINIVIL,ZESTRIL) 20 MG tablet Take 1 tablet (20 mg total) by mouth daily. 90 tablet 2  . metformin (FORTAMET)  1000 MG (OSM) 24 hr tablet Take 1 tablet (1,000 mg total) by mouth daily. 90 tablet 2  . metFORMIN (GLUCOPHAGE) 1000 MG tablet TAKE 1 TABLET (1,000 MG TOTAL) BY MOUTH 2 (TWO) TIMES DAILY WITH A MEAL. 60 tablet 0  . NON FORMULARY CBD Oil    . ONETOUCH DELICA LANCETS MISC by Does not apply route. Delica Lancets and test strips- DX: 250.00 test once daily     . phenazopyridine (PYRIDIUM) 100 MG tablet Take 1 tablet (100 mg total) by mouth 3 (three) times daily as needed for pain. 10 tablet 0  . rosuvastatin (CRESTOR) 40 MG tablet Take 1 tablet (40 mg total) by mouth daily. 90 tablet 3  . venlafaxine XR (EFFEXOR-XR) 75 MG 24 hr capsule TAKE 1 CAPSULE DAILY 90 capsule 3  . zolpidem (AMBIEN) 10 MG tablet TAKE 1 TABLET BY MOUTH EVERYDAY AT BEDTIME 90 tablet 0   No current facility-administered medications for this visit.    Allergies  Allergen Reactions  . Codeine        Objective:  BP 129/74   Pulse 89   Temp  98.1 F (36.7 C) (Oral)   Wt 148 lb (67.1 kg)   BMI 26.22 kg/m  Gen:  alert, not ill-appearing, no distress, appropriate for age HEENT: head normocephalic without obvious abnormality, conjunctiva and cornea clear, trachea midline Pulm: Normal work of breathing, normal phonation Neuro: alert and oriented x 3, no tremor MSK: extremities atraumatic, normal gait and station Skin: intact, no rashes on exposed skin, no jaundice, no cyanosis Psych: well-groomed, cooperative, good eye contact, euthymic mood, affect mood-congruent, speech is articulate, and thought processes clear and goal-directed    Results for orders placed or performed in visit on 08/18/17 (from the past 72 hour(s))  POCT Urinalysis Dipstick     Status: Abnormal   Collection Time: 08/18/17  3:36 PM  Result Value Ref Range   Color, UA yellow    Clarity, UA slightly cloudy    Glucose, UA Negative Negative   Bilirubin, UA negative    Ketones, UA trace    Spec Grav, UA >=1.030 (A) 1.010 - 1.025   Blood, UA  moderate    pH, UA 6.5 5.0 - 8.0   Protein, UA Positive (A) Negative   Urobilinogen, UA 0.2 0.2 or 1.0 E.U./dL   Nitrite, UA positive    Leukocytes, UA Small (1+) (A) Negative   Appearance     Odor     No results found.    Assessment and Plan: 62 y.o. female with   Dysuria - Plan: POCT Urinalysis Dipstick, Urine Culture, cefUROXime (CEFTIN) 250 MG tablet, phenazopyridine (PYRIDIUM) 100 MG tablet  Urine leukocytes - Plan: cefUROXime (CEFTIN) 250 MG tablet  Afebrile, no tachycardia, no flank pain UA grossly positive for leuks, nitrites, moderate blood Urine culture pending Treating empirically for uncomplicated cystitis with Ceftin bid x 7 days  Patient education and anticipatory guidance given Patient agrees with treatment plan Follow-up as needed if symptoms worsen or fail to improve  Levonne Hubert PA-C

## 2017-08-21 LAB — URINE CULTURE
MICRO NUMBER: 90916990
SPECIMEN QUALITY:: ADEQUATE

## 2017-08-29 ENCOUNTER — Encounter: Payer: Self-pay | Admitting: Physician Assistant

## 2017-08-30 MED ORDER — FLUCONAZOLE 150 MG PO TABS: 150.0000 mg | ORAL_TABLET | Freq: Once | ORAL | 0 refills | Status: AC

## 2017-08-31 ENCOUNTER — Other Ambulatory Visit: Payer: Self-pay | Admitting: Family Medicine

## 2017-08-31 DIAGNOSIS — Z1231 Encounter for screening mammogram for malignant neoplasm of breast: Secondary | ICD-10-CM

## 2017-09-02 ENCOUNTER — Ambulatory Visit (INDEPENDENT_AMBULATORY_CARE_PROVIDER_SITE_OTHER): Payer: 59

## 2017-09-02 DIAGNOSIS — Z1231 Encounter for screening mammogram for malignant neoplasm of breast: Secondary | ICD-10-CM | POA: Diagnosis not present

## 2017-09-05 ENCOUNTER — Encounter: Payer: Self-pay | Admitting: Family Medicine

## 2017-09-06 NOTE — Telephone Encounter (Signed)
Patient advised.

## 2017-09-06 NOTE — Telephone Encounter (Signed)
Left message for a return call

## 2017-09-22 ENCOUNTER — Encounter: Payer: Self-pay | Admitting: Family Medicine

## 2017-09-22 MED ORDER — METFORMIN HCL 1000 MG PO TABS: ORAL_TABLET | ORAL | 0 refills | Status: DC

## 2017-10-02 ENCOUNTER — Other Ambulatory Visit: Payer: Self-pay | Admitting: Family Medicine

## 2017-10-02 DIAGNOSIS — G4700 Insomnia, unspecified: Secondary | ICD-10-CM

## 2017-10-03 NOTE — Telephone Encounter (Signed)
Routing to pcp for signature.Krista Love, CMA  

## 2017-10-06 ENCOUNTER — Ambulatory Visit (INDEPENDENT_AMBULATORY_CARE_PROVIDER_SITE_OTHER): Payer: 59 | Admitting: Family Medicine

## 2017-10-06 ENCOUNTER — Encounter: Payer: Self-pay | Admitting: Family Medicine

## 2017-10-06 VITALS — BP 133/71 | HR 84 | Ht 63.0 in | Wt 145.0 lb

## 2017-10-06 DIAGNOSIS — E119 Type 2 diabetes mellitus without complications: Secondary | ICD-10-CM | POA: Diagnosis not present

## 2017-10-06 DIAGNOSIS — F341 Dysthymic disorder: Secondary | ICD-10-CM

## 2017-10-06 DIAGNOSIS — I10 Essential (primary) hypertension: Secondary | ICD-10-CM | POA: Diagnosis not present

## 2017-10-06 LAB — BASIC METABOLIC PANEL WITH GFR
BUN: 14 mg/dL (ref 7–25)
CHLORIDE: 100 mmol/L (ref 98–110)
CO2: 28 mmol/L (ref 20–32)
CREATININE: 0.8 mg/dL (ref 0.50–0.99)
Calcium: 10.2 mg/dL (ref 8.6–10.4)
GFR, EST AFRICAN AMERICAN: 90 mL/min/{1.73_m2} (ref >=60)
GFR, EST NON AFRICAN AMERICAN: 78 mL/min/{1.73_m2} (ref >=60)
Glucose, Bld: 117 mg/dL — ABNORMAL HIGH (ref 65–99)
Potassium: 4.3 mmol/L (ref 3.5–5.3)
Sodium: 140 mmol/L (ref 135–146)

## 2017-10-06 LAB — POCT GLYCOSYLATED HEMOGLOBIN (HGB A1C): HEMOGLOBIN A1C: 6.3 % — AB (ref 4.0–5.6)

## 2017-10-06 MED ORDER — LISINOPRIL 20 MG PO TABS: 20.0000 mg | ORAL_TABLET | Freq: Every day | ORAL | 1 refills | Status: DC

## 2017-10-06 MED ORDER — METFORMIN HCL 1000 MG PO TABS: ORAL_TABLET | ORAL | 1 refills | Status: DC

## 2017-10-06 MED ORDER — VENLAFAXINE HCL ER 75 MG PO CP24: 75.0000 mg | ORAL_CAPSULE | Freq: Every day | ORAL | 3 refills | Status: DC

## 2017-10-06 NOTE — Progress Notes (Signed)
Subjective:    CC: BP and DM  HPI:  Hypertension- Pt denies chest pain, SOB, dizziness, or heart palpitations.  Taking meds as directed w/o problems.  Denies medication side effects.    Diabetes - no hypoglycemic events. No wounds or sores that are not healing well. No increased thirst or urination. Checking glucose at home. Taking medications as prescribed without any side effects.On metformin.    F/U anxiety and depression - On effexor XR 75mg  daily.    Past medical history, Surgical history, Family history not pertinant except as noted below, Social history, Allergies, and medications have been entered into the medical record, reviewed, and corrections made.   Review of Systems: No fevers, chills, night sweats, weight loss, chest pain, or shortness of breath.   Objective:    General: Well Developed, well nourished, and in no acute distress.  Neuro: Alert and oriented x3, extra-ocular muscles intact, sensation grossly intact.  HEENT: Normocephalic, atraumatic  Skin: Warm and dry, no rashes. Cardiac: Regular rate and rhythm, no murmurs rubs or gallops, no lower extremity edema.  Respiratory: Clear to auscultation bilaterally. Not using accessory muscles, speaking in full sentences.   Impression and Recommendations:    HTN - Well controlled. Continue current regimen. Follow up in  4 months.    DM - due for foot exam today.  Well controlled. Continue current regimen. Follow up in  4months.  Due for BMP since on ACE. Also on statin.    Anxiety/Depression - dong well on her current regimen. PHQ- 9 score of 1.  GAD 7 score of 1.

## 2017-10-07 NOTE — Progress Notes (Signed)
All labs are normal. 

## 2017-10-15 ENCOUNTER — Other Ambulatory Visit: Payer: Self-pay | Admitting: Family Medicine

## 2017-12-06 ENCOUNTER — Encounter: Payer: Self-pay | Admitting: Family Medicine

## 2017-12-22 ENCOUNTER — Encounter: Payer: Self-pay | Admitting: Physician Assistant

## 2017-12-22 ENCOUNTER — Ambulatory Visit (INDEPENDENT_AMBULATORY_CARE_PROVIDER_SITE_OTHER): Payer: 59 | Admitting: Physician Assistant

## 2017-12-22 ENCOUNTER — Ambulatory Visit (INDEPENDENT_AMBULATORY_CARE_PROVIDER_SITE_OTHER): Payer: 59

## 2017-12-22 DIAGNOSIS — M659 Synovitis and tenosynovitis, unspecified: Secondary | ICD-10-CM | POA: Insufficient documentation

## 2017-12-22 DIAGNOSIS — M79641 Pain in right hand: Secondary | ICD-10-CM | POA: Diagnosis not present

## 2017-12-22 DIAGNOSIS — M79642 Pain in left hand: Secondary | ICD-10-CM

## 2017-12-22 DIAGNOSIS — M65949 Unspecified synovitis and tenosynovitis, unspecified hand: Secondary | ICD-10-CM | POA: Insufficient documentation

## 2017-12-22 LAB — HM DIABETES EYE EXAM

## 2017-12-22 MED ORDER — PREDNISONE 50 MG PO TABS: ORAL_TABLET | ORAL | 0 refills | Status: DC

## 2017-12-22 NOTE — Assessment & Plan Note (Signed)
Rapid onset synovitis, left third MCP. This can be the presenting symptom of rheumatoid arthritis. Adding 5 days of prednisone, bilateral x-rays. Sister does have RA. Checking full rheumatoid panel, uric acid levels. Return to see me in a month.

## 2017-12-22 NOTE — Progress Notes (Signed)
HPI:                                                                Krista Love is a 62 y.o. female who [facility] today for left hand swelling  Pleasant 62 yo F with PMH Type 2 diabetes presents with new onset left hand swelling and redness beginning yesterday morning. Reports tenderness overlying her third knuckle. Denies, fever, chills, malaise. Denies history of gout or arthropathy. Family hx significant for RA in her sister.   Past Medical History:  Diagnosis Date  . Diabetes mellitus   . Hyperlipidemia   . Hypertension    Past Surgical History:  Procedure Laterality Date  . back surgery for a ruptured disc     Social History   Tobacco Use  . Smoking status: Current Every Day Smoker    Packs/day: 0.50    Types: Cigarettes  . Smokeless tobacco: Never Used  Substance Use Topics  . Alcohol use: Yes   family history includes Cancer in her maternal grandfather; Diabetes in her brother; Heart disease (age of onset: 3565) in her sister; Hypertension in her mother; Stomach cancer in her sister.    ROS: negative except as noted in the HPI  Medications: Current Outpatient Medications  Medication Sig Dispense Refill  . glucose blood test strip One touch ultra test strip. DX: 250.00. Use to test once daily. 100 each 12  . lisinopril (PRINIVIL,ZESTRIL) 20 MG tablet Take 1 tablet (20 mg total) by mouth daily. Ok to refill in NOvember 90 tablet 1  . metFORMIN (GLUCOPHAGE) 1000 MG tablet TAKE 1 TABLET (1,000 MG TOTAL) BY MOUTH 2 (TWO) TIMES DAILY WITH A MEAL. 180 tablet 1  . NON FORMULARY CBD Oil    . ONETOUCH DELICA LANCETS MISC by Does not apply route. Delica Lancets and test strips- DX: 250.00 test once daily     . rosuvastatin (CRESTOR) 40 MG tablet Take 1 tablet (40 mg total) by mouth daily. 90 tablet 3  . venlafaxine XR (EFFEXOR-XR) 75 MG 24 hr capsule Take 1 capsule (75 mg total) by mouth daily. 90 capsule 3  .  zolpidem (AMBIEN) 10 MG tablet TAKE 1 TABLET BY MOUTH EVERYDAY AT BEDTIME 90 tablet 0   No current facility-administered medications for this visit.    Allergies  Allergen Reactions  . Codeine        Objective:  BP 126/74   Pulse 85   Temp 98.2 F (36.8 C) (Oral)   Wt 148 lb (67.1 kg)   BMI 26.22 kg/m  Gen:  alert, not ill-appearing, no distress, appropriate for age Left hand: visible swelling and overlying redness of the left 3rd MCP with mild swelling of the surrounding metacarpals, neurovascularly intact   No results found for this or any previous visit (from the past 72 hour(s)). No results found.    Assessment and Plan: 62 y.o. female with   Left 3rd MCP swelling/synovitis - Sports Medicine consulted (see A&P)    Patient education and anticipatory guidance given Patient agrees with treatment plan Follow-up as needed if symptoms worsen or fail to improve  Levonne Hubertharley E. Cummings PA-C

## 2017-12-22 NOTE — Progress Notes (Signed)
Subjective:    CC: Left hand pain  HPI: This is a pleasant 62 year old female, she has a family history of rheumatoid arthritis in her sister, over the past day and a half she is developed severe swelling in her left third metacarpophalangeal joint, with redness, morning stiffness, no fevers, chills, no trauma.  Localized without radiation.  I reviewed the past medical history, family history, social history, surgical history, and allergies today and no changes were needed.  Please see the problem list section below in epic for further details.  Past Medical History: Past Medical History:  Diagnosis Date  . Diabetes mellitus   . Hyperlipidemia   . Hypertension    Past Surgical History: Past Surgical History:  Procedure Laterality Date  . back surgery for a ruptured disc     Social History: Social History   Socioeconomic History  . Marital status: Married    Spouse name: Not on file  . Number of children: 1  . Years of education: Not on file  . Highest education level: Not on file  Occupational History  . 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 Financial resource strain: Not on file  . Food insecurity:    Worry: Not on file    Inability: Not on file  . Transportation needs:    Medical: Not on file    Non-medical: Not on file  Tobacco Use  . Smoking status: Current Every Day Smoker    Packs/day: 0.50    Types: Cigarettes  . Smokeless tobacco: Never Used  Substance and Sexual Activity  . Alcohol use: Yes  . Drug use: No  . Sexual activity: Not Currently  Lifestyle  . Physical activity:    Days per week: Not on file    Minutes per session: Not on file  . Stress: Not on file  Relationships  . Social connections:    Talks on phone: Not on file    Gets together: Not on file    Attends religious service: Not on file    Active member of club or organization: Not on file    Attends meetings of clubs or organizations:  Not on file    Relationship status: Not on file  Other Topics Concern  . Not on file  Social History Narrative   No regular exercise. 2 cups of coffee a days.    Family History: Family History  Problem Relation Age of Onset  . Hypertension Mother   . Diabetes Brother   . Cancer Maternal Grandfather        skin  . Stomach cancer Sister   . Heart disease Sister [id number]       stents.    Allergies: Allergies  Allergen Reactions  . Codeine    Medications: See med rec.  Review of Systems: No fevers, chills, night sweats, weight loss, chest pain, or shortness of breath.   Objective:    General: Well Developed, well nourished, and in no acute distress.  Neuro: Alert and oriented x3, extra-ocular muscles intact, sensation grossly intact.  HEENT: Normocephalic, atraumatic, pupils equal round reactive to light, neck supple, no masses, no lymphadenopathy, thyroid nonpalpable.  Skin: Warm and dry, no rashes. Cardiac: Regular rate and rhythm, no murmurs rubs or gallops, no lower extremity edema.  Respiratory: Clear to auscultation bilaterally. Not using accessory muscles, speaking in full sentences. Left hand: Swollen, tender, erythematous third metacarpophalangeal joint with tenderness to palpation.  Impression and Recommendations:  Synovitis of left third metacarpophalangeal joint Rapid onset synovitis, left third MCP. This can be the presenting symptom of rheumatoid arthritis. Adding 5 days of prednisone, bilateral x-rays. Sister does have RA. Checking full rheumatoid panel, uric acid levels. Return to see me in a month. ___________________________________________ Ihor Austin. Benjamin Stain, M.D., ABFM., CAQSM. Primary Care and Sports Medicine [facility]  Adjunct Professor of Family Medicine  [employer]

## 2017-12-28 LAB — CBC WITH DIFFERENTIAL/PLATELET
Basophils Absolute: 22 cells/uL (ref 0–200)
Basophils Relative: 0.3 %
Eosinophils Absolute: 202 cells/uL (ref 15–500)
Eosinophils Relative: 2.8 %
HCT: 39.3 % (ref 35.0–45.0)
Hemoglobin: 13.2 g/dL (ref 11.7–15.5)
Lymphs Abs: 2815 cells/uL (ref 850–3900)
MCH: 30 pg (ref 27.0–33.0)
MCHC: 33.6 g/dL (ref 32.0–36.0)
MCV: 89.3 fL (ref 80.0–100.0)
MPV: 9 fL (ref 7.5–12.5)
Monocytes Relative: 8.4 %
Neutro Abs: 3557 cells/uL (ref 1500–7800)
Neutrophils Relative %: 49.4 %
Platelets: 338 10*3/uL (ref 140–400)
RBC: 4.4 Million/uL (ref 3.80–5.10)
RDW: 12.4 % (ref 11.0–15.0)
Total Lymphocyte: 39.1 %
WBC mixed population: 605 {cells}/uL (ref 200–950)
WBC: 7.2 Thousand/uL (ref 3.8–10.8)

## 2017-12-28 LAB — COMPREHENSIVE METABOLIC PANEL
AG Ratio: 1.7 (calc) (ref 1.0–2.5)
ALT: 23 U/L (ref 6–29)
AST: 17 U/L (ref 10–35)
Albumin: 4.3 g/dL (ref 3.6–5.1)
Alkaline phosphatase (APISO): 83 U/L (ref 33–130)
CO2: 28 mmol/L (ref 20–32)
Calcium: 10.2 mg/dL (ref 8.6–10.4)
Chloride: 100 mmol/L (ref 98–110)
Globulin: 2.6 g/dL (calc) (ref 1.9–3.7)
Glucose, Bld: 146 mg/dL — ABNORMAL HIGH (ref 65–99)
Potassium: 4.1 mmol/L (ref 3.5–5.3)
Sodium: 138 mmol/L (ref 135–146)
Total Protein: 6.9 g/dL (ref 6.1–8.1)

## 2017-12-28 LAB — COMPREHENSIVE METABOLIC PANEL WITH GFR
BUN: 19 mg/dL (ref 7–25)
Creat: 0.78 mg/dL (ref 0.50–0.99)
Total Bilirubin: 0.3 mg/dL (ref 0.2–1.2)

## 2017-12-28 LAB — LUPUS(12) PANEL
Anti Nuclear Antibody(ANA): POSITIVE — AB
C3 Complement: 138 mg/dL (ref 83–193)
C4 Complement: 35 mg/dL (ref 15–57)
ENA SM Ab Ser-aCnc: <1 AI
Rheumatoid fact SerPl-aCnc: <14 IU/mL (ref <14)
Ribosomal P Protein Ab: <1 AI
SM/RNP: <1 AI
SSA (Ro) (ENA) Antibody, IgG: <1 AI
SSB (La) (ENA) Antibody, IgG: <1 AI
Scleroderma (Scl-70) (ENA) Antibody, IgG: <1 AI
Thyroperoxidase Ab SerPl-aCnc: 1 IU/mL (ref <9)
ds DNA Ab: 1 IU/mL

## 2017-12-28 LAB — SEDIMENTATION RATE: Sed Rate: 14 mm/h (ref 0–30)

## 2017-12-28 LAB — ANTI-NUCLEAR AB-TITER (ANA TITER): ANA Titer 1: 1:40 {titer} — ABNORMAL HIGH

## 2017-12-28 LAB — RHEUMATOID FACTOR (IGA, IGG, IGM)
Rheumatoid Factor (IgA): <5 U (ref <=6)
Rheumatoid Factor (IgG): <5 U (ref <=6)
Rheumatoid Factor (IgM): <5 U (ref <=6)

## 2017-12-28 LAB — CYCLIC CITRUL PEPTIDE ANTIBODY, IGG: Cyclic Citrullin Peptide Ab: <16 UNITS

## 2017-12-28 LAB — URIC ACID: Uric Acid, Serum: 4.6 mg/dL (ref 2.5–7.0)

## 2017-12-30 ENCOUNTER — Other Ambulatory Visit: Payer: Self-pay | Admitting: Family Medicine

## 2017-12-30 DIAGNOSIS — G4700 Insomnia, unspecified: Secondary | ICD-10-CM

## 2017-12-30 MED ORDER — ZOLPIDEM TARTRATE 10 MG PO TABS: 5.0000 mg | ORAL_TABLET | Freq: Every evening | ORAL | 0 refills | Status: DC | PRN

## 2018-01-19 ENCOUNTER — Encounter: Payer: Self-pay | Admitting: Family Medicine

## 2018-01-20 ENCOUNTER — Ambulatory Visit: Payer: 59 | Admitting: Sports Medicine

## 2018-02-01 ENCOUNTER — Encounter: Payer: Self-pay | Admitting: Family Medicine

## 2018-02-07 ENCOUNTER — Encounter: Payer: Self-pay | Admitting: Family Medicine

## 2018-02-21 ENCOUNTER — Other Ambulatory Visit: Payer: Self-pay | Admitting: *Deleted

## 2018-02-21 DIAGNOSIS — E119 Type 2 diabetes mellitus without complications: Secondary | ICD-10-CM

## 2018-02-21 DIAGNOSIS — I10 Essential (primary) hypertension: Secondary | ICD-10-CM

## 2018-02-21 DIAGNOSIS — E785 Hyperlipidemia, unspecified: Secondary | ICD-10-CM

## 2018-02-21 MED ORDER — LISINOPRIL 20 MG PO TABS: 20.0000 mg | ORAL_TABLET | Freq: Every day | ORAL | 1 refills | Status: DC

## 2018-02-21 MED ORDER — ROSUVASTATIN CALCIUM 40 MG PO TABS: 40.0000 mg | ORAL_TABLET | Freq: Every day | ORAL | 3 refills | Status: DC

## 2018-02-21 MED ORDER — METFORMIN HCL 1000 MG PO TABS: ORAL_TABLET | ORAL | 1 refills | Status: DC

## 2018-03-02 ENCOUNTER — Ambulatory Visit (INDEPENDENT_AMBULATORY_CARE_PROVIDER_SITE_OTHER): Payer: BLUE CROSS/BLUE SHIELD | Admitting: Family Medicine

## 2018-03-02 ENCOUNTER — Encounter: Payer: Self-pay | Admitting: Family Medicine

## 2018-03-02 VITALS — BP 128/72 | HR 99 | Ht 63.0 in | Wt 143.0 lb

## 2018-03-02 DIAGNOSIS — Z720 Tobacco use: Secondary | ICD-10-CM | POA: Diagnosis not present

## 2018-03-02 DIAGNOSIS — E785 Hyperlipidemia, unspecified: Secondary | ICD-10-CM

## 2018-03-02 DIAGNOSIS — I10 Essential (primary) hypertension: Secondary | ICD-10-CM

## 2018-03-02 DIAGNOSIS — E119 Type 2 diabetes mellitus without complications: Secondary | ICD-10-CM | POA: Diagnosis not present

## 2018-03-02 LAB — POCT GLYCOSYLATED HEMOGLOBIN (HGB A1C): Hemoglobin A1C: 6.8 % — AB (ref 4.0–5.6)

## 2018-03-02 MED ORDER — BLOOD GLUCOSE METER KIT: PACK | 0 refills | Status: DC

## 2018-03-02 MED ORDER — VENLAFAXINE HCL ER 37.5 MG PO CP24: 37.5000 mg | ORAL_CAPSULE | Freq: Every day | ORAL | 0 refills | Status: DC

## 2018-03-02 NOTE — Patient Instructions (Signed)
Take the Effexor 75mg  every other day with 37.5 on the days in between for 8 days. Then decrease to 37.5mg  daily x 8 days then take one every other day for 8 days.

## 2018-03-02 NOTE — Progress Notes (Signed)
Subjective:    CC:   HPI: She is actually planning to retire soon and so feels like her stress levels have decreased significantly.  She really like to work on discontinuing her Effexor but did not know if it had to be tapered.  Hypertension- Pt denies chest pain, SOB, dizziness, or heart palpitations.  Taking meds as directed w/o problems.  Denies medication side effects.    Diabetes - no hypoglycemic events. No wounds or sores that are not healing well. No increased thirst or urination. Checking glucose at home. Taking medications as prescribed without any side effects.  Hyperlipideima - due to recheck lipids.    Tob abuse - she is still smoking.    Past medical history, Surgical history, Family history not pertinant except as noted below, Social history, Allergies, and medications have been entered into the medical record, reviewed, and corrections made.   Review of Systems: No fevers, chills, night sweats, weight loss, chest pain, or shortness of breath.   Objective:    General: Well Developed, well nourished, and in no acute distress.  Neuro: Alert and oriented x3, extra-ocular muscles intact, sensation grossly intact.  HEENT: Normocephalic, atraumatic  Skin: Warm and dry, no rashes. Cardiac: Regular rate and rhythm, no murmurs rubs or gallops, no lower extremity edema.  Respiratory: Clear to auscultation bilaterally. Not using accessory muscles, speaking in full sentences.   Impression and Recommendations:   HTN - Well controlled. Continue current regimen. Follow up in  6 months.    DM -A1c still well controlled but up from previous now 6.8 previous was 6.3.  She would like a new meter and just encourage her to continue to work on healthy diet and regular exercise will continue current regimen and for now and have her follow back up in 3 months.  Hyperlipidemia -repeat screening lipids.  It is been a year.  Tobacco abuse -she is still continuing to smoke.  Encourage  cessation.

## 2018-03-10 ENCOUNTER — Encounter: Payer: Self-pay | Admitting: Family Medicine

## 2018-03-10 DIAGNOSIS — R195 Other fecal abnormalities: Secondary | ICD-10-CM

## 2018-03-13 DIAGNOSIS — R195 Other fecal abnormalities: Secondary | ICD-10-CM | POA: Diagnosis not present

## 2018-03-13 DIAGNOSIS — E119 Type 2 diabetes mellitus without complications: Secondary | ICD-10-CM | POA: Diagnosis not present

## 2018-03-13 DIAGNOSIS — E785 Hyperlipidemia, unspecified: Secondary | ICD-10-CM | POA: Diagnosis not present

## 2018-03-13 DIAGNOSIS — I10 Essential (primary) hypertension: Secondary | ICD-10-CM | POA: Diagnosis not present

## 2018-03-13 LAB — LIPID PANEL
Cholesterol: 163 mg/dL (ref <200)
HDL: 69 mg/dL (ref >=50)
LDL Cholesterol (Calc): 74 mg/dL (calc)
Non-HDL Cholesterol (Calc): 94 mg/dL (calc) (ref <130)
TRIGLYCERIDES: 118 mg/dL (ref <150)
Total CHOL/HDL Ratio: 2.4 (calc) (ref <5.0)

## 2018-03-14 LAB — CBC WITH DIFFERENTIAL/PLATELET
Absolute Monocytes: 508 cells/uL (ref 200–950)
Basophils Absolute: 12 cells/uL (ref 0–200)
Basophils Relative: 0.2 %
Eosinophils Absolute: 192 cells/uL (ref 15–500)
Eosinophils Relative: 3.1 %
HCT: 38.5 % (ref 35.0–45.0)
Hemoglobin: 12.9 g/dL (ref 11.7–15.5)
Lymphs Abs: 1941 cells/uL (ref 850–3900)
MCH: 30.5 pg (ref 27.0–33.0)
MCHC: 33.5 g/dL (ref 32.0–36.0)
MCV: 91 fL (ref 80.0–100.0)
MPV: 8.8 fL (ref 7.5–12.5)
Monocytes Relative: 8.2 %
Neutro Abs: 3546 cells/uL (ref 1500–7800)
Neutrophils Relative %: 57.2 %
Platelets: 295 10*3/uL (ref 140–400)
RBC: 4.23 10*6/uL (ref 3.80–5.10)
RDW: 12.6 % (ref 11.0–15.0)
Total Lymphocyte: 31.3 %
WBC: 6.2 10*3/uL (ref 3.8–10.8)

## 2018-03-14 LAB — LIPASE: Lipase: 26 U/L (ref 7–60)

## 2018-03-14 LAB — AMYLASE: Amylase: 23 U/L (ref 21–101)

## 2018-03-14 NOTE — Progress Notes (Signed)
All labs are normal. 

## 2018-03-22 ENCOUNTER — Encounter: Payer: Self-pay | Admitting: Family Medicine

## 2018-03-23 ENCOUNTER — Encounter: Payer: Self-pay | Admitting: Family Medicine

## 2018-03-23 DIAGNOSIS — G4700 Insomnia, unspecified: Secondary | ICD-10-CM

## 2018-03-23 MED ORDER — SAXAGLIPTIN HCL 2.5 MG PO TABS: 2.5000 mg | ORAL_TABLET | Freq: Every day | ORAL | 3 refills | Status: DC

## 2018-03-23 MED ORDER — ZOLPIDEM TARTRATE 10 MG PO TABS: 5.0000 mg | ORAL_TABLET | Freq: Every evening | ORAL | 0 refills | Status: DC | PRN

## 2018-03-23 NOTE — Telephone Encounter (Signed)
Med sent.

## 2018-06-05 ENCOUNTER — Encounter: Payer: Self-pay | Admitting: Family Medicine

## 2018-06-05 ENCOUNTER — Ambulatory Visit (INDEPENDENT_AMBULATORY_CARE_PROVIDER_SITE_OTHER): Payer: BLUE CROSS/BLUE SHIELD | Admitting: Family Medicine

## 2018-06-05 VITALS — Ht 63.0 in | Wt 143.0 lb

## 2018-06-05 DIAGNOSIS — R809 Proteinuria, unspecified: Secondary | ICD-10-CM

## 2018-06-05 DIAGNOSIS — F341 Dysthymic disorder: Secondary | ICD-10-CM | POA: Diagnosis not present

## 2018-06-05 DIAGNOSIS — E1129 Type 2 diabetes mellitus with other diabetic kidney complication: Secondary | ICD-10-CM | POA: Diagnosis not present

## 2018-06-05 DIAGNOSIS — I10 Essential (primary) hypertension: Secondary | ICD-10-CM

## 2018-06-05 DIAGNOSIS — E119 Type 2 diabetes mellitus without complications: Secondary | ICD-10-CM

## 2018-06-05 MED ORDER — VENLAFAXINE HCL ER 75 MG PO CP24: 75.0000 mg | ORAL_CAPSULE | Freq: Every day | ORAL | 0 refills | Status: DC

## 2018-06-05 NOTE — Progress Notes (Signed)
BS:115  She reports that she went back to effexor xr 75 mg since the pandemic. Other than that she is doing well.Laureen Ochs, Viann Shove, CMA

## 2018-06-05 NOTE — Progress Notes (Signed)
Virtual Visit via Video Note  I connected with Krista Love [facility] on 06/05/18 at  7:30 AM EDT by a video enabled telemedicine application and verified that I am speaking with the correct person using two identifiers.   I discussed the limitations of evaluation and management by telemedicine and the availability of in person appointments. The patient expressed understanding and agreed to proceed.  Pt was at home and I was in my office for the virtual visit.     Subjective:    CC: F/U DM  HPI: Diabetes - no hypoglycemic events. No wounds or sores that are not healing well. No increased thirst or urination. Checking glucose at home. Taking medications as prescribed without any side effects. Sugars up and down but has been cooking more.    Hypertension- Pt denies chest pain, SOB, dizziness, or heart palpitations.  Taking meds as directed w/o problems.  Denies medication side effects.    Follow-up anxiety/depression - we had decreased her dose last time to 37. 5 mg She reports that she went back to effexor xr 75 mg since the pandemic. Other than that she is doing well.     Past medical history, Surgical history, Family history not pertinant except as noted below, Social history, Allergies, and medications have been entered into the medical record, reviewed, and corrections made.   Review of Systems: No fevers, chills, night sweats, weight loss, chest pain, or shortness of breath.   Objective:    General: Speaking clearly in complete sentences without any shortness of breath.  Alert and oriented x3.  Normal judgment. No apparent acute distress. Well groomed.    Impression and Recommendations:    DM - glucose up and down a little so suspect her A1c may be a little bit more elevated than last time.  But we will keep an eye on it encouraged her to go the lab when she feels comfortable.  HTN - she feels asymptomatic.  Follow it periodically.  Due for BMP.  Microalbuminuria due to  diabetes-currently on an ACE inhibitor.  Due for BMP to recheck renal function and potassium.  Anxiety/depression- she is doing OK on the increased Effexor 75mg .  OK to refill when needed. F/U in 3 weeks.       I discussed the assessment and treatment plan with the patient. The patient was provided an opportunity to ask questions and all were answered. The patient agreed with the plan and demonstrated an understanding of the instructions.   The patient was advised to call back or seek an in-person evaluation if the symptoms worsen or if the condition fails to improve as anticipated.   Nani Gasser, MD \

## 2018-06-20 ENCOUNTER — Other Ambulatory Visit: Payer: Self-pay | Admitting: Family Medicine

## 2018-06-20 DIAGNOSIS — G4700 Insomnia, unspecified: Secondary | ICD-10-CM

## 2018-06-22 ENCOUNTER — Encounter: Payer: Self-pay | Admitting: Family Medicine

## 2018-07-15 ENCOUNTER — Other Ambulatory Visit: Payer: Self-pay | Admitting: Family Medicine

## 2018-07-26 DIAGNOSIS — E119 Type 2 diabetes mellitus without complications: Secondary | ICD-10-CM | POA: Diagnosis not present

## 2018-07-26 DIAGNOSIS — I10 Essential (primary) hypertension: Secondary | ICD-10-CM | POA: Diagnosis not present

## 2018-07-27 LAB — HEMOGLOBIN A1C
Hgb A1c MFr Bld: 6 % of total Hgb — ABNORMAL HIGH (ref <5.7)
Mean Plasma Glucose: 126 (calc)
eAG (mmol/L): 7 (calc)

## 2018-07-27 LAB — BASIC METABOLIC PANEL WITH GFR
BUN: 10 mg/dL (ref 7–25)
CO2: 29 mmol/L (ref 20–32)
Calcium: 10 mg/dL (ref 8.6–10.4)
Chloride: 101 mmol/L (ref 98–110)
Creat: 0.66 mg/dL (ref 0.50–0.99)
GFR, Est African American: 107 mL/min/{1.73_m2} (ref >=60)
GFR, Est Non African American: 93 mL/min/{1.73_m2} (ref >=60)
Glucose, Bld: 113 mg/dL — ABNORMAL HIGH (ref 65–99)
Potassium: 4 mmol/L (ref 3.5–5.3)
Sodium: 139 mmol/L (ref 135–146)

## 2018-08-29 ENCOUNTER — Other Ambulatory Visit: Payer: Self-pay | Admitting: Family Medicine

## 2018-08-29 NOTE — Telephone Encounter (Signed)
Please call patient and see what dose she is taking.  We had increased her dose back to 75 mg at the beginning of the [place] pandemic.  So I am wondering if this is an error from [facility] for refill on her previous dose.

## 2018-09-04 NOTE — Telephone Encounter (Signed)
Patient states that she is trying to wean down completely off of it anad needs this refill so she can do the taper dose off.

## 2018-09-22 ENCOUNTER — Other Ambulatory Visit: Payer: Self-pay | Admitting: Family Medicine

## 2018-09-22 DIAGNOSIS — G4700 Insomnia, unspecified: Secondary | ICD-10-CM

## 2018-10-02 ENCOUNTER — Other Ambulatory Visit: Payer: Self-pay | Admitting: Family Medicine

## 2018-10-11 ENCOUNTER — Encounter: Payer: Self-pay | Admitting: Family Medicine

## 2018-10-16 ENCOUNTER — Other Ambulatory Visit: Payer: Self-pay | Admitting: Family Medicine

## 2018-10-31 ENCOUNTER — Other Ambulatory Visit: Payer: Self-pay | Admitting: Family Medicine

## 2018-11-14 ENCOUNTER — Other Ambulatory Visit: Payer: Self-pay | Admitting: Family Medicine

## 2018-11-28 ENCOUNTER — Encounter: Payer: Self-pay | Admitting: Family Medicine

## 2018-11-28 ENCOUNTER — Ambulatory Visit (INDEPENDENT_AMBULATORY_CARE_PROVIDER_SITE_OTHER): Payer: BC Managed Care – PPO | Admitting: Family Medicine

## 2018-11-28 VITALS — BP 124/80 | Wt 147.0 lb

## 2018-11-28 DIAGNOSIS — F341 Dysthymic disorder: Secondary | ICD-10-CM

## 2018-11-28 DIAGNOSIS — Z720 Tobacco use: Secondary | ICD-10-CM | POA: Diagnosis not present

## 2018-11-28 DIAGNOSIS — I10 Essential (primary) hypertension: Secondary | ICD-10-CM | POA: Diagnosis not present

## 2018-11-28 DIAGNOSIS — Z1231 Encounter for screening mammogram for malignant neoplasm of breast: Secondary | ICD-10-CM

## 2018-11-28 DIAGNOSIS — E119 Type 2 diabetes mellitus without complications: Secondary | ICD-10-CM | POA: Diagnosis not present

## 2018-11-28 DIAGNOSIS — G4700 Insomnia, unspecified: Secondary | ICD-10-CM

## 2018-11-28 NOTE — Patient Instructions (Signed)
Okay to hold the Onglyza and we will see how she does without it.  Not be covered by her new insurance.

## 2018-11-28 NOTE — Assessment & Plan Note (Signed)
Due for A1c if everything looks good then we can follow-up in 6 months.  Though she really wants to try to get off the Onglyza because she will be switching to Medicare and that particular medication is not well covered.  If her last A1c is as good as it was last time there are 6.0 then we can try discontinuing the Onglyza and see how she does with just the Metformin, diet and exercise.  In which case we would need to follow-up in 3 months instead.

## 2018-11-28 NOTE — Progress Notes (Signed)
BS: 117  She would like to discuss finding another medication in place of the Onglyza 2.5 mg tablet. Or if she can come of this medication completely.  She is concerned because she will be on medicare and is in the process of switching over to a Medicare Advantage plan.

## 2018-11-28 NOTE — Progress Notes (Signed)
Virtual Visit via Video Note  I connected with Krista Love [facility] on 11/28/18 at  1:00 PM EST by a video enabled telemedicine application and verified that I am speaking with the correct person using two identifiers.   I discussed the limitations of evaluation and management by telemedicine and the availability of in person appointments. The patient expressed understanding and agreed to proceed.    Established Patient Office Visit  Subjective:  Patient ID: Krista Love, female    DOB: [birth year]  Age: 63 y.o. MRN: [id number]  CC:  Chief Complaint  Patient presents with  . Follow-up    HPI Krista Love [facility] presents for  Hypertension- Pt denies chest pain, SOB, dizziness, or heart palpitations.  Taking meds as directed w/o problems.  Denies medication side effects.    Diabetes - no hypoglycemic events. No wounds or sores that are not healing well. No increased thirst or urination. Checking glucose at home. Taking medications as prescribed without any side effects.  F/U insomnia - doing well on her ambien.  No side effects or concerns.  She did let me know that she will be retiring soon and will be switching to Medicare in the onglyza will not be covered well on her new insurance plan.  In regards to mood she is actually down to 37.5 mg of Effexor and would like to hopefully come off sometime after she retires.  She will feels like that a really reduce her stress levels.  Past Medical History:  Diagnosis Date  . Diabetes mellitus   . Hyperlipidemia   . Hypertension     Past Surgical History:  Procedure Laterality Date  . back surgery for a ruptured disc      Family History  Problem Relation Age of Onset  . Hypertension Mother   . Diabetes Brother   . Cancer Maternal Grandfather        skin  . Stomach cancer Sister   . Heart disease Sister 78       stents.     Social History   Socioeconomic History  . Marital status: Divorced    Spouse name: Not on file  . Number of  children: 1  . Years of education: Not on file  . Highest education level: Not on file  Occupational History  . Occupation: Press photographer REPORTING [employer]    Employer: [employer]  Social Needs  . Financial resource strain: Not on file  . Food insecurity    Worry: Not on file    Inability: Not on file  . Transportation needs    Medical: Not on file    Non-medical: Not on file  Tobacco Use  . Smoking status: Current Every Day Smoker    Packs/day: 0.50    Types: Cigarettes  . Smokeless tobacco: Never Used  Substance and Sexual Activity  . Alcohol use: Yes  . Drug use: No  . Sexual activity: Not Currently  Lifestyle  . Physical activity    Days per week: Not on file    Minutes per session: Not on file  . Stress: Not on file  Relationships  . Social Herbalist on phone: Not on file    Gets together: Not on file    Attends religious service: Not on file    Active member of club or organization: Not on file    Attends meetings of clubs or organizations: Not on file    Relationship status: Not on file  .  Intimate partner violence    Fear of current or ex partner: Not on file    Emotionally abused: Not on file    Physically abused: Not on file    Forced sexual activity: Not on file  Other Topics Concern  . Not on file  Social History Narrative   No regular exercise. 2 cups of coffee a days.     Outpatient Medications Prior to Visit  Medication Sig Dispense Refill  . blood glucose meter kit and supplies Dispense based on patient and insurance preference. Use up to four times daily as directed.DX: E11.9 1 each 0  . CONTOUR NEXT TEST test strip TEST BLOOD SUGAR UP TO FOUR TIMES DAILY 200 strip 11  . lisinopril (PRINIVIL,ZESTRIL) 20 MG tablet Take 1 tablet (20 mg total) by mouth daily. 90 tablet 1  . metFORMIN (GLUCOPHAGE) 1000 MG tablet TAKE 1 TABLET (1,000 MG TOTAL) BY MOUTH 2 (TWO) TIMES DAILY WITH A MEAL. 180 tablet 1  . Microlet Lancets MISC TEST FOUR TIMES  DAILY 200 each 11  . NON FORMULARY CBD Oil    . ONGLYZA 2.5 MG TABS tablet TAKE 1 TABLET BY MOUTH EVERY DAY 30 tablet 6  . rosuvastatin (CRESTOR) 40 MG tablet Take 1 tablet (40 mg total) by mouth daily. 90 tablet 3  . venlafaxine XR (EFFEXOR-XR) 37.5 MG 24 hr capsule TAKE ONE CAPSULE BY MOUTH DAILY WITH BREAKFAST 90 capsule 1  . zolpidem (AMBIEN) 10 MG tablet TAKE 1/2 TO 1 TABLET BY MOUTH AT BEDTIME AS NEEDED FOR SLEEP 90 tablet 0   No facility-administered medications prior to visit.     Allergies  Allergen Reactions  . Codeine     ROS Review of Systems    Objective:    Physical Exam  Constitutional: She is oriented to person, place, and time. She appears well-developed and well-nourished.  HENT:  Head: Normocephalic and atraumatic.  Pulmonary/Chest: Effort normal.  Neurological: She is alert and oriented to person, place, and time.  Skin: Skin is warm and dry.  Psychiatric: She has a normal mood and affect. Her behavior is normal.    BP 124/80   Wt 147 lb (66.7 kg)   BMI 26.04 kg/m  Wt Readings from Last 3 Encounters:  11/28/18 147 lb (66.7 kg)  06/05/18 143 lb (64.9 kg)  03/02/18 143 lb (64.9 kg)     Health Maintenance Due  Topic Date Due  . PNA vac Low Risk Adult (1 of 2 - PCV13) 04/24/2018  . INFLUENZA VACCINE  08/19/2018  . FOOT EXAM  10/07/2018    There are no preventive care reminders to display for this patient.  Lab Results  Component Value Date   TSH 2.858 03/18/2010   Lab Results  Component Value Date   WBC 6.2 03/13/2018   HGB 12.9 03/13/2018   HCT 38.5 03/13/2018   MCV 91.0 03/13/2018   PLT 295 03/13/2018   Lab Results  Component Value Date   NA 139 07/26/2018   K 4.0 07/26/2018   CO2 29 07/26/2018   GLUCOSE 113 (H) 07/26/2018   BUN 10 07/26/2018   CREATININE 0.66 07/26/2018   BILITOT 0.3 12/22/2017   ALKPHOS 74 02/25/2016   AST 17 12/22/2017   ALT 23 12/22/2017   PROT 6.9 12/22/2017   ALBUMIN 4.3 02/25/2016   CALCIUM 10.0  07/26/2018   Lab Results  Component Value Date   CHOL 163 03/13/2018   Lab Results  Component Value Date   HDL 69 03/13/2018  Lab Results  Component Value Date   LDLCALC 74 03/13/2018   Lab Results  Component Value Date   TRIG 118 03/13/2018   Lab Results  Component Value Date   CHOLHDL 2.4 03/13/2018   Lab Results  Component Value Date   HGBA1C 6.0 (H) 07/26/2018      Assessment & Plan:   Problem List Items Addressed This Visit      Cardiovascular and Mediastinum   HYPERTENSION, BENIGN - Primary    Well controlled. Continue current regimen. Follow up in  6 mo      Relevant Orders   HgB A1c   BMP w/ GFR     Endocrine   Diabetes type 2, controlled ([place])    Due for A1c if everything looks good then we can follow-up in 6 months.  Though she really wants to try to get off the Onglyza because she will be switching to Medicare and that particular medication is not well covered.  If her last A1c is as good as it was last time there are 6.0 then we can try discontinuing the Onglyza and see how she does with just the Metformin, diet and exercise.  In which case we would need to follow-up in 3 months instead.      Relevant Orders   HgB A1c   BMP w/ GFR     Other   Tobacco abuse   Insomnia    Well on her Ambien.  Continue current regimen.      ANXIETY DEPRESSION    Doing well on venlafaxine.  She is now down to 37.5 and is happy with her regimen she is actually can be retiring in the next month and so eventually will want to wean off the medication she thinks that her really reduce her stress levels.       Other Visit Diagnoses    Screening mammogram, encounter for       Relevant Orders   MM SCREENING BREAST TOMO BILATERAL      No orders of the defined types were placed in this encounter.   Follow-up: Return in about 3 months (around 02/28/2019) for Diabetes follow-up.    I discussed the assessment and treatment plan with the patient. The patient was  provided an opportunity to ask questions and all were answered. The patient agreed with the plan and demonstrated an understanding of the instructions.   The patient was advised to call back or seek an in-person evaluation if the symptoms worsen or if the condition fails to improve as anticipated.    Beatrice Lecher, MD

## 2018-11-28 NOTE — Assessment & Plan Note (Signed)
Well on her Ambien.  Continue current regimen.

## 2018-11-28 NOTE — Assessment & Plan Note (Signed)
Doing well on venlafaxine.  She is now down to 37.5 and is happy with her regimen she is actually can be retiring in the next month and so eventually will want to wean off the medication she thinks that her really reduce her stress levels.

## 2018-11-28 NOTE — Assessment & Plan Note (Signed)
Well controlled. Continue current regimen. Follow up in  6 mo  

## 2018-12-11 DIAGNOSIS — I10 Essential (primary) hypertension: Secondary | ICD-10-CM | POA: Diagnosis not present

## 2018-12-11 DIAGNOSIS — E119 Type 2 diabetes mellitus without complications: Secondary | ICD-10-CM | POA: Diagnosis not present

## 2018-12-12 ENCOUNTER — Encounter: Payer: Self-pay | Admitting: Family Medicine

## 2018-12-12 LAB — BASIC METABOLIC PANEL WITH GFR
BUN: 15 mg/dL (ref 7–25)
CO2: 27 mmol/L (ref 20–32)
Calcium: 10 mg/dL (ref 8.6–10.4)
Chloride: 99 mmol/L (ref 98–110)
Creat: 0.68 mg/dL (ref 0.50–0.99)
GFR, Est African American: 106 mL/min/{1.73_m2} (ref >=60)
GFR, Est Non African American: 92 mL/min/{1.73_m2} (ref >=60)
Glucose, Bld: 133 mg/dL — ABNORMAL HIGH (ref 65–99)
Potassium: 4.1 mmol/L (ref 3.5–5.3)
Sodium: 139 mmol/L (ref 135–146)

## 2018-12-12 LAB — HEMOGLOBIN A1C
Hgb A1c MFr Bld: 6 % of total Hgb — ABNORMAL HIGH (ref <5.7)
Mean Plasma Glucose: 126 (calc)
eAG (mmol/L): 7 (calc)

## 2018-12-13 ENCOUNTER — Encounter: Payer: Self-pay | Admitting: Family Medicine

## 2018-12-13 ENCOUNTER — Ambulatory Visit (INDEPENDENT_AMBULATORY_CARE_PROVIDER_SITE_OTHER): Payer: BC Managed Care – PPO | Admitting: Family Medicine

## 2018-12-13 DIAGNOSIS — E119 Type 2 diabetes mellitus without complications: Secondary | ICD-10-CM

## 2018-12-13 MED ORDER — ONGLYZA 2.5 MG PO TABS: 2.5000 mg | ORAL_TABLET | Freq: Every day | ORAL | 0 refills | Status: DC

## 2018-12-13 NOTE — Progress Notes (Signed)
Established Patient Office Visit  Subjective:  Patient ID: Krista Love, female    DOB: [birth year]  Age: 63 y.o. MRN: [id number]  CC:  Chief Complaint  Patient presents with  . Follow-up    HPI: Krista Love presents to go over lab results from recent diabetic follow-up.  Her hemoglobin A1c was 6.0.  Her goal has been to get off of Onglyza because she will actually be switching to Medicare and she knows that it will not be covered well.  In fact we had discussed that if the A1c was similar to the previous time around 6.0 even better than we would try discontinuing the Onglyza and just continuing the Metformin and see how she does with that and then plan to follow-up in 3 months.  She does want to be able to discuss options that might be available.  Past Medical History:  Diagnosis Date  . Diabetes mellitus   . Hyperlipidemia   . Hypertension     Past Surgical History:  Procedure Laterality Date  . back surgery for a ruptured disc      Family History  Problem Relation Age of Onset  . Hypertension Mother   . Diabetes Brother   . Cancer Maternal Grandfather        skin  . Stomach cancer Sister   . Heart disease Sister 23       stents.     Social History   Socioeconomic History  . Marital status: Divorced    Spouse name: Not on file  . Number of children: 1  . Years of education: Not on file  . Highest education level: Not on file  Occupational History  . Occupation: Press photographer REPORTING [employer]    Employer: [employer]  Social Needs  . Financial resource strain: Not on file  . Food insecurity    Worry: Not on file    Inability: Not on file  . Transportation needs    Medical: Not on file    Non-medical: Not on file  Tobacco Use  . Smoking status: Current Every Day Smoker    Packs/day: 0.50    Types: Cigarettes  . Smokeless tobacco: Never Used  Substance and Sexual Activity  . Alcohol use: Yes  . Drug use: No  . Sexual activity: Not Currently   Lifestyle  . Physical activity    Days per week: Not on file    Minutes per session: Not on file  . Stress: Not on file  Relationships  . Social Herbalist on phone: Not on file    Gets together: Not on file    Attends religious service: Not on file    Active member of club or organization: Not on file    Attends meetings of clubs or organizations: Not on file    Relationship status: Not on file  . Intimate partner violence    Fear of current or ex partner: Not on file    Emotionally abused: Not on file    Physically abused: Not on file    Forced sexual activity: Not on file  Other Topics Concern  . Not on file  Social History Narrative   No regular exercise. 2 cups of coffee a days.     Outpatient Medications Prior to Visit  Medication Sig Dispense Refill  . blood glucose meter kit and supplies Dispense based on patient and insurance preference. Use up to four times daily as directed.DX: E11.9 1 each  0  . CONTOUR NEXT TEST test strip TEST BLOOD SUGAR UP TO FOUR TIMES DAILY 200 strip 11  . lisinopril (PRINIVIL,ZESTRIL) 20 MG tablet Take 1 tablet (20 mg total) by mouth daily. 90 tablet 1  . metFORMIN (GLUCOPHAGE) 1000 MG tablet TAKE 1 TABLET (1,000 MG TOTAL) BY MOUTH 2 (TWO) TIMES DAILY WITH A MEAL. 180 tablet 1  . Microlet Lancets MISC TEST FOUR TIMES DAILY 200 each 11  . NON FORMULARY CBD Oil    . rosuvastatin (CRESTOR) 40 MG tablet Take 1 tablet (40 mg total) by mouth daily. 90 tablet 3  . venlafaxine XR (EFFEXOR-XR) 37.5 MG 24 hr capsule TAKE ONE CAPSULE BY MOUTH DAILY WITH BREAKFAST 90 capsule 1  . zolpidem (AMBIEN) 10 MG tablet TAKE 1/2 TO 1 TABLET BY MOUTH AT BEDTIME AS NEEDED FOR SLEEP 90 tablet 0  . ONGLYZA 2.5 MG TABS tablet TAKE 1 TABLET BY MOUTH EVERY DAY (Patient not taking: Reported on 12/13/2018) 30 tablet 6   No facility-administered medications prior to visit.     Allergies  Allergen Reactions  . Codeine     ROS Review of Systems     Objective:    Physical Exam  Constitutional: She is oriented to person, place, and time. She appears well-developed and well-nourished.  HENT:  Head: Normocephalic and atraumatic.  Eyes: Conjunctivae and EOM are normal.  Pulmonary/Chest: Effort normal.  Neurological: She is alert and oriented to person, place, and time.  Skin: Skin is dry. No pallor.  Psychiatric: She has a normal mood and affect. Her behavior is normal.  Vitals reviewed.   BP 127/80   Wt 147 lb (66.7 kg)   BMI 26.04 kg/m  Wt Readings from Last 3 Encounters:  12/13/18 147 lb (66.7 kg)  11/28/18 147 lb (66.7 kg)  06/05/18 143 lb (64.9 kg)     Health Maintenance Due  Topic Date Due  . PNA vac Low Risk Adult (1 of 2 - PCV13) 04/24/2018  . INFLUENZA VACCINE  08/19/2018  . FOOT EXAM  10/07/2018    There are no preventive care reminders to display for this patient.  Lab Results  Component Value Date   TSH 2.858 03/18/2010   Lab Results  Component Value Date   WBC 6.2 03/13/2018   HGB 12.9 03/13/2018   HCT 38.5 03/13/2018   MCV 91.0 03/13/2018   PLT 295 03/13/2018   Lab Results  Component Value Date   NA 139 12/11/2018   K 4.1 12/11/2018   CO2 27 12/11/2018   GLUCOSE 133 (H) 12/11/2018   BUN 15 12/11/2018   CREATININE 0.68 12/11/2018   BILITOT 0.3 12/22/2017   ALKPHOS 74 02/25/2016   AST 17 12/22/2017   ALT 23 12/22/2017   PROT 6.9 12/22/2017   ALBUMIN 4.3 02/25/2016   CALCIUM 10.0 12/11/2018   Lab Results  Component Value Date   CHOL 163 03/13/2018   Lab Results  Component Value Date   HDL 69 03/13/2018   Lab Results  Component Value Date   LDLCALC 74 03/13/2018   Lab Results  Component Value Date   TRIG 118 03/13/2018   Lab Results  Component Value Date   CHOLHDL 2.4 03/13/2018   Lab Results  Component Value Date   HGBA1C 6.0 (H) 12/11/2018      Assessment & Plan:   Problem List Items Addressed This Visit      Endocrine   Diabetes type 2, controlled ([place])     Last A1c looked fantastic  at 6.0.  Right now she has at least 2 more refills on the Onglyza she is going to pick 1 up today which means should be able to refill it 1 more time right before the end of the year which will get her to the end of January.  We also discussed maybe trying to see if they would be willing to fill a 90-day supply if it is not too expensive that would actually then get her into about the middle of February which will be great.  At that point when she has been off the medication for 3 months we will check her A1c and see how she does with just Metformin knowing that her A1c will go up slightly.  If it is just a small amount then we can continue to monitor if it jumps up significantly then we will have to discuss alternatives.  We did discuss potential of using glipizide and/or Actos in the future.  Discussed potential side effects of those medications including weight gain and hypoglycemia.      Relevant Medications   saxagliptin HCl (ONGLYZA) 2.5 MG TABS tablet      She has her mammogram scheduled for January.  Meds ordered this encounter  Medications  . saxagliptin HCl (ONGLYZA) 2.5 MG TABS tablet    Sig: Take 1 tablet (2.5 mg total) by mouth daily.    Dispense:  90 tablet    Refill:  0    Follow-up: No follow-ups on file.    Beatrice Lecher, MD

## 2018-12-13 NOTE — Assessment & Plan Note (Signed)
Last A1c looked fantastic at 6.0.  Right now she has at least 2 more refills on the Onglyza she is going to pick 1 up today which means should be able to refill it 1 more time right before the end of the year which will get her to the end of January.  We also discussed maybe trying to see if they would be willing to fill a 90-day supply if it is not too expensive that would actually then get her into about the middle of February which will be great.  At that point when she has been off the medication for 3 months we will check her A1c and see how she does with just Metformin knowing that her A1c will go up slightly.  If it is just a small amount then we can continue to monitor if it jumps up significantly then we will have to discuss alternatives.  We did discuss potential of using glipizide and/or Actos in the future.  Discussed potential side effects of those medications including weight gain and hypoglycemia.

## 2018-12-25 ENCOUNTER — Other Ambulatory Visit: Payer: Self-pay | Admitting: *Deleted

## 2018-12-25 DIAGNOSIS — G4700 Insomnia, unspecified: Secondary | ICD-10-CM

## 2018-12-26 MED ORDER — ZOLPIDEM TARTRATE 10 MG PO TABS: 5.0000 mg | ORAL_TABLET | Freq: Every evening | ORAL | 0 refills | Status: DC | PRN

## 2018-12-29 DIAGNOSIS — D229 Melanocytic nevi, unspecified: Secondary | ICD-10-CM | POA: Diagnosis not present

## 2018-12-29 DIAGNOSIS — L821 Other seborrheic keratosis: Secondary | ICD-10-CM | POA: Diagnosis not present

## 2019-01-16 ENCOUNTER — Other Ambulatory Visit: Payer: Self-pay | Admitting: *Deleted

## 2019-01-16 DIAGNOSIS — I10 Essential (primary) hypertension: Secondary | ICD-10-CM

## 2019-01-16 MED ORDER — LISINOPRIL 20 MG PO TABS: 20.0000 mg | ORAL_TABLET | Freq: Every day | ORAL | 3 refills | Status: DC

## 2019-01-22 ENCOUNTER — Encounter: Payer: Self-pay | Admitting: Family Medicine

## 2019-01-22 ENCOUNTER — Other Ambulatory Visit: Payer: Self-pay | Admitting: *Deleted

## 2019-01-22 DIAGNOSIS — E119 Type 2 diabetes mellitus without complications: Secondary | ICD-10-CM

## 2019-01-22 MED ORDER — METFORMIN HCL 1000 MG PO TABS: ORAL_TABLET | ORAL | 1 refills | Status: DC

## 2019-01-26 ENCOUNTER — Ambulatory Visit: Payer: BC Managed Care – PPO

## 2019-01-29 ENCOUNTER — Encounter: Payer: Self-pay | Admitting: Family Medicine

## 2019-01-29 ENCOUNTER — Other Ambulatory Visit: Payer: Self-pay | Admitting: *Deleted

## 2019-01-29 DIAGNOSIS — E119 Type 2 diabetes mellitus without complications: Secondary | ICD-10-CM

## 2019-01-29 MED ORDER — VENLAFAXINE HCL ER 37.5 MG PO CP24: ORAL_CAPSULE | ORAL | 1 refills | Status: DC

## 2019-01-29 MED ORDER — METFORMIN HCL 1000 MG PO TABS: ORAL_TABLET | ORAL | 1 refills | Status: DC

## 2019-01-29 MED ORDER — ACCU-CHEK AVIVA PLUS W/DEVICE KIT: PACK | Status: DC

## 2019-01-29 MED ORDER — ACCU-CHEK AVIVA PLUS W/DEVICE KIT: PACK | 99 refills | Status: DC

## 2019-01-29 MED ORDER — ACCU-CHEK AVIVA PLUS VI STRP: ORAL_STRIP | 12 refills | Status: DC

## 2019-02-06 ENCOUNTER — Encounter: Payer: Self-pay | Admitting: Family Medicine

## 2019-02-15 ENCOUNTER — Encounter: Payer: Self-pay | Admitting: Family Medicine

## 2019-03-05 ENCOUNTER — Other Ambulatory Visit: Payer: Self-pay

## 2019-03-05 ENCOUNTER — Ambulatory Visit
Admission: RE | Admit: 2019-03-05 | Discharge: 2019-03-05 | Disposition: A | Discharge status: Home / Self Care | Payer: Medicare Other | Source: Ambulatory Visit | Attending: Family Medicine | Admitting: Family Medicine

## 2019-03-05 DIAGNOSIS — Z1231 Encounter for screening mammogram for malignant neoplasm of breast: Secondary | ICD-10-CM

## 2019-03-19 ENCOUNTER — Encounter: Payer: Self-pay | Admitting: Family Medicine

## 2019-03-19 DIAGNOSIS — G4700 Insomnia, unspecified: Secondary | ICD-10-CM

## 2019-03-20 MED ORDER — ZOLPIDEM TARTRATE 10 MG PO TABS: 5.0000 mg | ORAL_TABLET | Freq: Every evening | ORAL | 0 refills | Status: DC | PRN

## 2019-03-20 NOTE — Telephone Encounter (Signed)
Is like it is probably an insurance issue.  They may not allow her to have a 90-day because it is a scheduled drug.  But we will have no way of knowing that unless the patient calls [place] and tells [place] so that we can correct the prescription.  We will go ahead and send over new prescription but again just encourage her to really work on taking a half a tab.  That is what is considered safe and effective for women particularly over the age of 71.

## 2019-03-20 NOTE — Addendum Note (Signed)
Addended by: Nani Gasser D on: 03/20/2019 01:36 PM   Modules accepted: Orders

## 2019-03-20 NOTE — Telephone Encounter (Signed)
Called pharmacy.   90 day RX that was sent on 12/26/18 was filled #30 on 12/8, #30 on 1/2, #30 on 1/27. Spoke with [employer] at [facility].   Please advise on RF.

## 2019-03-26 ENCOUNTER — Telehealth (INDEPENDENT_AMBULATORY_CARE_PROVIDER_SITE_OTHER): Payer: Medicare Other | Admitting: Family Medicine

## 2019-03-26 ENCOUNTER — Encounter: Payer: Self-pay | Admitting: Family Medicine

## 2019-03-26 DIAGNOSIS — E1129 Type 2 diabetes mellitus with other diabetic kidney complication: Secondary | ICD-10-CM | POA: Diagnosis not present

## 2019-03-26 DIAGNOSIS — E785 Hyperlipidemia, unspecified: Secondary | ICD-10-CM | POA: Diagnosis not present

## 2019-03-26 DIAGNOSIS — R809 Proteinuria, unspecified: Secondary | ICD-10-CM

## 2019-03-26 DIAGNOSIS — G4700 Insomnia, unspecified: Secondary | ICD-10-CM | POA: Diagnosis not present

## 2019-03-26 DIAGNOSIS — I10 Essential (primary) hypertension: Secondary | ICD-10-CM | POA: Diagnosis not present

## 2019-03-26 DIAGNOSIS — E119 Type 2 diabetes mellitus without complications: Secondary | ICD-10-CM

## 2019-03-26 MED ORDER — ZOLPIDEM TARTRATE 5 MG PO TABS: 5.0000 mg | ORAL_TABLET | Freq: Every evening | ORAL | 0 refills | Status: DC | PRN

## 2019-03-26 MED ORDER — ROSUVASTATIN CALCIUM 40 MG PO TABS: 40.0000 mg | ORAL_TABLET | Freq: Every day | ORAL | 3 refills | Status: DC

## 2019-03-26 NOTE — Assessment & Plan Note (Signed)
Far doing well on Ambien.  Next time she is due for refill will have them fill the 5 mg tab.

## 2019-03-26 NOTE — Progress Notes (Signed)
Virtual Visit via Video Note  I connected with Krista Love [facility] on 03/26/19 at  1:00 PM EST by a video enabled telemedicine application and verified that I am speaking with the correct person using two identifiers.   I discussed the limitations of evaluation and management by telemedicine and the availability of in person appointments. The patient expressed understanding and agreed to proceed.  Subjective:    CC: F/u DM   HPI:  Diabetes - no hypoglycemic events. No wounds or sores that are not healing well. No increased thirst or urination. Checking glucose at home. Taking medications as prescribed without any side effects.  Hypertension- Pt denies chest pain, SOB, dizziness, or heart palpitations.  Taking meds as directed w/o problems.  Denies medication side effects.   F/U Insomnia -we actually contacted her about really decreasing her Ambien dose from 10 mg down to 5 mg.  She is now on dec dose of Ambien to 5mg . She has been doing OK.  She feels like it still effective overall.  She has had the first Moderna COVID vaccine at .   Mammogram done and eye exam due next months.    Past medical history, Surgical history, Family history not pertinant except as noted below, Social history, Allergies, and medications have been entered into the medical record, reviewed, and corrections made.   Review of Systems: No fevers, chills, night sweats, weight loss, chest pain, or shortness of breath.   Objective:    General: Speaking clearly in complete sentences without any shortness of breath.  Alert and oriented x3.  Normal judgment. No apparent acute distress.    Impression and Recommendations:    HYPERTENSION, BENIGN She reports that her home blood pressures have been well controlled.  She did not actually take it today but I did encourage her to try to check it over the next few weeks and then let [employer] know if her systolic pressures are greater than 135.  He is due for some updated  blood work so encouraged her to go fasting at her convenience we also need to get an up-to-date lipid panel.  Diabetes type 2, controlled Last A1c looks phenomenal at 6.04 months ago encouraged her to go to the lab and get that redrawn so he can make sure that she is still at goal.  It sounds like her fasting blood sugars have been under 120 pretty consistently.  Microalbuminuria due to type 2 diabetes mellitus (HCC) On daily ACE inhibitor.  Hyperlipidemia Overdue to recheck lipids.  She will get the lab fasting.  Insomnia Far doing well on Ambien.  Next time she is due for refill will have them fill the 5 mg tab.    Time spent in encounter 21 minutes  I discussed the assessment and treatment plan with the patient. The patient was provided an opportunity to ask questions and all were answered. The patient agreed with the plan and demonstrated an understanding of the instructions.   The patient was advised to call back or seek an in-person evaluation if the symptoms worsen or if the condition fails to improve as anticipated.   [place], MD

## 2019-03-26 NOTE — Assessment & Plan Note (Signed)
Overdue to recheck lipids.  She will get the lab fasting.

## 2019-03-26 NOTE — Assessment & Plan Note (Signed)
Last A1c looks phenomenal at 6.04 months ago encouraged her to go to the lab and get that redrawn so he can make sure that she is still at goal.  It sounds like her fasting blood sugars have been under 120 pretty consistently.

## 2019-03-26 NOTE — Assessment & Plan Note (Signed)
She reports that her home blood pressures have been well controlled.  She did not actually take it today but I did encourage her to try to check it over the next few weeks and then let us know if her systolic pressures are greater than 135.  He is due for some updated blood work so encouraged her to go fasting at her convenience we also need to get an up-to-date lipid panel.

## 2019-03-26 NOTE — Progress Notes (Signed)
Pt states no new concerns.  Blood sugar: 117  Fasting today

## 2019-03-26 NOTE — Assessment & Plan Note (Signed)
On daily ACE inhibitor.

## 2019-04-09 ENCOUNTER — Other Ambulatory Visit: Payer: Self-pay | Admitting: *Deleted

## 2019-04-09 DIAGNOSIS — I10 Essential (primary) hypertension: Secondary | ICD-10-CM

## 2019-04-09 MED ORDER — LISINOPRIL 20 MG PO TABS: 20.0000 mg | ORAL_TABLET | Freq: Every day | ORAL | 3 refills | Status: DC

## 2019-04-11 ENCOUNTER — Other Ambulatory Visit: Payer: Self-pay | Admitting: *Deleted

## 2019-04-11 DIAGNOSIS — E785 Hyperlipidemia, unspecified: Secondary | ICD-10-CM

## 2019-04-11 MED ORDER — ROSUVASTATIN CALCIUM 40 MG PO TABS: 40.0000 mg | ORAL_TABLET | Freq: Every day | ORAL | 3 refills | Status: DC

## 2019-04-30 DIAGNOSIS — E785 Hyperlipidemia, unspecified: Secondary | ICD-10-CM | POA: Diagnosis not present

## 2019-04-30 DIAGNOSIS — I10 Essential (primary) hypertension: Secondary | ICD-10-CM | POA: Diagnosis not present

## 2019-04-30 DIAGNOSIS — E119 Type 2 diabetes mellitus without complications: Secondary | ICD-10-CM | POA: Diagnosis not present

## 2019-05-01 LAB — LIPID PANEL
Cholesterol: 205 mg/dL — ABNORMAL HIGH (ref <200)
HDL: 71 mg/dL (ref >=50)
LDL Cholesterol (Calc): 107 mg/dL (calc) — ABNORMAL HIGH
Non-HDL Cholesterol (Calc): 134 mg/dL (calc) — ABNORMAL HIGH (ref <130)
Total CHOL/HDL Ratio: 2.9 (calc) (ref <5.0)
Triglycerides: 154 mg/dL — ABNORMAL HIGH (ref <150)

## 2019-05-01 LAB — COMPLETE METABOLIC PANEL WITH GFR
AG Ratio: 1.9 (calc) (ref 1.0–2.5)
ALT: 22 U/L (ref 6–29)
AST: 15 U/L (ref 10–35)
Albumin: 4.3 g/dL (ref 3.6–5.1)
Alkaline phosphatase (APISO): 77 U/L (ref 37–153)
BUN: 17 mg/dL (ref 7–25)
CO2: 28 mmol/L (ref 20–32)
Calcium: 9.6 mg/dL (ref 8.6–10.4)
Chloride: 101 mmol/L (ref 98–110)
Creat: 0.83 mg/dL (ref 0.50–0.99)
GFR, Est African American: 85 mL/min/{1.73_m2} (ref >=60)
GFR, Est Non African American: 73 mL/min/{1.73_m2} (ref >=60)
Globulin: 2.3 g/dL (calc) (ref 1.9–3.7)
Glucose, Bld: 106 mg/dL — ABNORMAL HIGH (ref 65–99)
Potassium: 4.7 mmol/L (ref 3.5–5.3)
Sodium: 137 mmol/L (ref 135–146)
Total Bilirubin: 0.4 mg/dL (ref 0.2–1.2)
Total Protein: 6.6 g/dL (ref 6.1–8.1)

## 2019-05-01 LAB — HEMOGLOBIN A1C
Hgb A1c MFr Bld: 6 % of total Hgb — ABNORMAL HIGH (ref <5.7)
Mean Plasma Glucose: 126 (calc)
eAG (mmol/L): 7 (calc)

## 2019-05-08 ENCOUNTER — Encounter: Payer: Self-pay | Admitting: Family Medicine

## 2019-05-10 DIAGNOSIS — E119 Type 2 diabetes mellitus without complications: Secondary | ICD-10-CM | POA: Diagnosis not present

## 2019-05-10 DIAGNOSIS — H524 Presbyopia: Secondary | ICD-10-CM | POA: Diagnosis not present

## 2019-05-10 LAB — HM DIABETES EYE EXAM

## 2019-05-11 ENCOUNTER — Encounter: Payer: Self-pay | Admitting: Family Medicine

## 2019-05-11 ENCOUNTER — Ambulatory Visit (INDEPENDENT_AMBULATORY_CARE_PROVIDER_SITE_OTHER): Payer: Medicare Other | Admitting: Family Medicine

## 2019-05-11 ENCOUNTER — Other Ambulatory Visit: Payer: Self-pay

## 2019-05-11 VITALS — BP 128/81 | HR 89 | Wt 137.0 lb

## 2019-05-11 DIAGNOSIS — E785 Hyperlipidemia, unspecified: Secondary | ICD-10-CM

## 2019-05-11 DIAGNOSIS — H6121 Impacted cerumen, right ear: Secondary | ICD-10-CM

## 2019-05-11 DIAGNOSIS — I10 Essential (primary) hypertension: Secondary | ICD-10-CM | POA: Diagnosis not present

## 2019-05-11 DIAGNOSIS — G4700 Insomnia, unspecified: Secondary | ICD-10-CM | POA: Diagnosis not present

## 2019-05-11 DIAGNOSIS — H93A1 Pulsatile tinnitus, right ear: Secondary | ICD-10-CM

## 2019-05-11 DIAGNOSIS — R634 Abnormal weight loss: Secondary | ICD-10-CM

## 2019-05-11 MED ORDER — ZOLPIDEM TARTRATE 5 MG PO TABS: 5.0000 mg | ORAL_TABLET | Freq: Every evening | ORAL | 0 refills | Status: DC | PRN

## 2019-05-11 NOTE — Assessment & Plan Note (Signed)
Gust options.  We can put a little bit more focus on diet and exercise over the next 3 to 4 months and then plan to recheck it or we could consider changing medications.  She opts to really focus on diet and exercise and recheck.  He is already on 40 mg of Crestor daily and tolerates it well otherwise.

## 2019-05-11 NOTE — Assessment & Plan Note (Signed)
Pressure looks good today I do not think it is contributing to the pulsing sensation in her ear.

## 2019-05-11 NOTE — Assessment & Plan Note (Signed)
She has splitting her Ambien and only taking 5 mg and says she is actually been doing okay overall.  We did discuss that when I refilled her medication again we would just do the 5 mg to see if it could be better covered by her insurance and as we have discussed previously now that she is over 65 is the FDA approved dose for women and for those over 65.

## 2019-05-11 NOTE — Progress Notes (Signed)
Established Patient Office Visit  Subjective:  Patient ID: Krista Love, female    DOB: 1955/09/06  Age: 64 y.o. MRN: [id number]  CC: No chief complaint on file.   HPI Krista Love [facility] presents for   C/o of pulsing in her right ear usually late in th evening. No nasal congestion. No hearing loss.    No cold sxs.  He says it started about a week ago.  It does not occur every day but it just comes and goes but often times in the evening.  She not had any pain in that area over that side of her neck.  Also wanted to discuss weight loss.  She is actually lost about 10 pounds since she was here in November and says typically she has a really hard time losing weight but she has any been purposely been trying in its been coming off.  But she denies any unusual night sweats or fevers or abnormal lumps or bumps or moles etc.  No abdominal pain chest pain breathing problems etc.  She did retire in January and admits that her diet and schedule have been a little bit different.  She has been more active especially in the yard but she has not been exercising per se.  She is also been cooking a little bit more.  She says she only really eats twice a day since she retired.  She does try to get enough protein in.  Hyperlipidemia-she wanted to discuss her cholesterol results.  Her LDL jumped up significantly to 107 which was almost a 30 point increase from about a year ago she reports that she is taking her Crestor very regularly she is very consistent and takes it every single night so she is not sure why it jumped up and again in fact she is actually lost about 10 pounds.  She says she cooks with things like all of oil she is active.  Past Medical History:  Diagnosis Date  . Diabetes mellitus   . Hyperlipidemia   . Hypertension     Past Surgical History:  Procedure Laterality Date  . back surgery for a ruptured disc      Family History  Problem Relation Age of Onset  . Hypertension Mother   .  Diabetes Brother   . Cancer Maternal Grandfather        skin  . Stomach cancer Sister   . Heart disease Sister 55       stents.     Social History   Socioeconomic History  . Marital status: Divorced    Spouse name: Not on file  . Number of children: 1  . Years of education: Not on file  . Highest education level: Not on file  Occupational History  . Occupation: Press photographer REPORTING [employer]    Employer: [employer]  Tobacco Use  . Smoking status: Current Every Day Smoker    Packs/day: 0.50    Types: Cigarettes  . Smokeless tobacco: Never Used  Substance and Sexual Activity  . Alcohol use: Yes  . Drug use: No  . Sexual activity: Not Currently  Other Topics Concern  . Not on file  Social History Narrative   No regular exercise. 2 cups of coffee a days.    Social Determinants of Health   Financial Resource Strain:   . Difficulty of Paying Living Expenses:   Food Insecurity:   . Worried About Charity fundraiser in the Last Year:   .  Ran Out of Food in the Last Year:   Transportation Needs:   . Film/video editor (Medical):   Marland Kitchen Lack of Transportation (Non-Medical):   Physical Activity:   . Days of Exercise per Week:   . Minutes of Exercise per Session:   Stress:   . Feeling of Stress :   Social Connections:   . Frequency of Communication with Friends and Family:   . Frequency of Social Gatherings with Friends and Family:   . Attends Religious Services:   . Active Member of Clubs or Organizations:   . Attends Archivist Meetings:   Marland Kitchen Marital Status:   Intimate Partner Violence:   . Fear of Current or Ex-Partner:   . Emotionally Abused:   Marland Kitchen Physically Abused:   . Sexually Abused:     Outpatient Medications Prior to Visit  Medication Sig Dispense Refill  . blood glucose meter kit and supplies Dispense based on patient and insurance preference. Use up to four times daily as directed.DX: E11.9 1 each 0  . Blood Glucose Monitoring Suppl (ACCU-CHEK  AVIVA PLUS) w/Device KIT Check fasting blood sugar every morning and 2 hours after largest meal of the day 3 times a week. Dx DM E11.9 1 kit prn  . glucose blood (ACCU-CHEK AVIVA PLUS) test strip Check fasting blood sugar every morning and 2 hours after largest meal of the day 3 times a week. Dx DM E11.9 100 each 12  . lisinopril (ZESTRIL) 20 MG tablet Take 1 tablet (20 mg total) by mouth daily. 90 tablet 3  . metFORMIN (GLUCOPHAGE) 1000 MG tablet TAKE 1 TABLET (1,000 MG TOTAL) BY MOUTH 2 (TWO) TIMES DAILY WITH A MEAL. 180 tablet 1  . Microlet Lancets MISC TEST FOUR TIMES DAILY 200 each 11  . NON FORMULARY CBD Oil    . rosuvastatin (CRESTOR) 40 MG tablet Take 1 tablet (40 mg total) by mouth daily. 90 tablet 3  . saxagliptin HCl (ONGLYZA) 2.5 MG TABS tablet Take 1 tablet (2.5 mg total) by mouth daily. 90 tablet 0  . venlafaxine XR (EFFEXOR-XR) 37.5 MG 24 hr capsule TAKE ONE CAPSULE BY MOUTH DAILY WITH BREAKFAST 90 capsule 1  . zolpidem (AMBIEN) 5 MG tablet Take 1 tablet (5 mg total) by mouth at bedtime as needed. for sleep 1 tablet 0   No facility-administered medications prior to visit.    Allergies  Allergen Reactions  . Codeine     ROS Review of Systems    Objective:    Physical Exam  Constitutional: She is oriented to person, place, and time. She appears well-developed and well-nourished.  HENT:  Head: Normocephalic and atraumatic.  Cerumen impaction in the right ear.  After removal TM and canal were clear.  Cardiovascular: Normal rate, regular rhythm and normal heart sounds.  No carotid bruits.  Pulmonary/Chest: Effort normal and breath sounds normal.  Neurological: She is alert and oriented to person, place, and time.  Skin: Skin is warm and dry.  Psychiatric: She has a normal mood and affect. Her behavior is normal.    BP 128/81 (BP Location: Left Arm, Patient Position: Sitting, Cuff Size: Normal)   Pulse 89   Wt 137 lb (62.1 kg)   SpO2 100%   BMI 24.27 kg/m  Wt  Readings from Last 3 Encounters:  05/11/19 137 lb (62.1 kg)  12/13/18 147 lb (66.7 kg)  11/28/18 147 lb (66.7 kg)     Health Maintenance Due  Topic Date Due  . PNA vac  Low Risk Adult (1 of 2 - PCV13) 04/24/2018  . FOOT EXAM  10/07/2018  . COVID-19 Vaccine (2 - Moderna 2-dose series) 03/31/2019    There are no preventive care reminders to display for this patient.  Lab Results  Component Value Date   TSH 2.858 03/18/2010   Lab Results  Component Value Date   WBC 6.2 03/13/2018   HGB 12.9 03/13/2018   HCT 38.5 03/13/2018   MCV 91.0 03/13/2018   PLT 295 03/13/2018   Lab Results  Component Value Date   NA 137 04/30/2019   K 4.7 04/30/2019   CO2 28 04/30/2019   GLUCOSE 106 (H) 04/30/2019   BUN 17 04/30/2019   CREATININE 0.83 04/30/2019   BILITOT 0.4 04/30/2019   ALKPHOS 74 02/25/2016   AST 15 04/30/2019   ALT 22 04/30/2019   PROT 6.6 04/30/2019   ALBUMIN 4.3 02/25/2016   CALCIUM 9.6 04/30/2019   Lab Results  Component Value Date   CHOL 205 (H) 04/30/2019   Lab Results  Component Value Date   HDL 71 04/30/2019   Lab Results  Component Value Date   LDLCALC 107 (H) 04/30/2019   Lab Results  Component Value Date   TRIG 154 (H) 04/30/2019   Lab Results  Component Value Date   CHOLHDL 2.9 04/30/2019   Lab Results  Component Value Date   HGBA1C 6.0 (H) 04/30/2019      Assessment & Plan:   Problem List Items Addressed This Visit      Cardiovascular and Mediastinum   HYPERTENSION, BENIGN - Primary    Pressure looks good today I do not think it is contributing to the pulsing sensation in her ear.        Other   Insomnia    She has splitting her Ambien and only taking 5 mg and says she is actually been doing okay overall.  We did discuss that when I refilled her medication again we would just do the 5 mg to see if it could be better covered by her insurance and as we have discussed previously now that she is over 51 is the FDA approved dose for women  and for those over 65.      Relevant Medications   zolpidem (AMBIEN) 5 MG tablet   Hyperlipidemia    Gust options.  We can put a little bit more focus on diet and exercise over the next 3 to 4 months and then plan to recheck it or we could consider changing medications.  She opts to really focus on diet and exercise and recheck.  He is already on 40 mg of Crestor daily and tolerates it well otherwise.       Other Visit Diagnoses    Abnormal weight loss       Relevant Orders   CBC   TSH   Pulsatile tinnitus of right ear       Impacted cerumen of right ear         Abnormal weight loss-I do think some of this could just be a change in lifestyle since she retired in January so we discussed maybe keeping an eye on it for about the next 30 days just to see if she maintains her weight or if she is continuing to lose.  She is up-to-date on her cancer screenings and she does not have any other physical symptoms that would indicate other underlying problems or cancer and she just recently had blood work which was normal.  We could consider checking her thyroid.    Pulsatile l tinnitus of the right ear-explained most likely benign etiology especially since her blood pressure is normal, no evidence of carotid bruits on exam and normal ear exam.  Explained that this typically will go away on its own.  If at any point she develops any changes such as hearing loss, persistence warts not going away at all or pain or discharge from the ear then please let us know.  Ruman impaction of right ear-did recommend removal so that we could better evaluate the ear since that was her main complaint today.  Indication: Cerumen impaction of the ear(s) Medical necessity statement: On physical examination, cerumen impairs clinically significant portions of the external auditory canal, and tympanic membrane. Noted obstructive, copious cerumen that cannot be removed without magnification and instrumentations Consent:  Discussed benefits and risks of procedure and verbal consent obtained Procedure: Patient was prepped for the procedure. Utilized an otoscope to assess and take note of the ear canal, the tympanic membrane, and the presence, amount, and placement of the cerumen. Gentle water irrigation and soft plastic curette was utilized to remove cerumen.  Post procedure examination: shows cerumen was completely removed. Patient tolerated procedure well. The patient is made aware that they may experience temporary vertigo, temporary hearing loss, and temporary discomfort. If these symptom last for more than 24 hours to call the clinic or proceed to the ED.    Meds ordered this encounter  Medications  . zolpidem (AMBIEN) 5 MG tablet    Sig: Take 1 tablet (5 mg total) by mouth at bedtime as needed. for sleep    Dispense:  90 tablet    Refill:  0    Follow-up: Return in about 3 months (around 08/10/2019) for Hypertension.    Beatrice Lecher, MD

## 2019-05-14 ENCOUNTER — Encounter: Payer: Self-pay | Admitting: Family Medicine

## 2019-05-29 ENCOUNTER — Other Ambulatory Visit: Payer: Self-pay | Admitting: Family Medicine

## 2019-05-29 DIAGNOSIS — E119 Type 2 diabetes mellitus without complications: Secondary | ICD-10-CM

## 2019-06-09 ENCOUNTER — Other Ambulatory Visit: Payer: Self-pay | Admitting: Family Medicine

## 2019-06-29 ENCOUNTER — Other Ambulatory Visit: Payer: Self-pay | Admitting: *Deleted

## 2019-06-29 ENCOUNTER — Telehealth: Payer: Self-pay | Admitting: *Deleted

## 2019-06-29 DIAGNOSIS — G4700 Insomnia, unspecified: Secondary | ICD-10-CM

## 2019-06-29 NOTE — Telephone Encounter (Signed)
Fax from mail order for zolpidem denied she p/u from local pharmacy on 05/11/19 #90 not due.

## 2019-07-11 ENCOUNTER — Encounter: Payer: Self-pay | Admitting: Family Medicine

## 2019-07-11 DIAGNOSIS — Z122 Encounter for screening for malignant neoplasm of respiratory organs: Secondary | ICD-10-CM

## 2019-07-11 NOTE — Telephone Encounter (Signed)
Referral placed for lung cancer screening. 

## 2019-07-12 ENCOUNTER — Encounter: Payer: Self-pay | Admitting: Family Medicine

## 2019-07-16 ENCOUNTER — Telehealth: Payer: Self-pay | Admitting: Acute Care

## 2019-07-16 DIAGNOSIS — F17210 Nicotine dependence, cigarettes, uncomplicated: Secondary | ICD-10-CM

## 2019-07-16 DIAGNOSIS — Z87891 Personal history of nicotine dependence: Secondary | ICD-10-CM

## 2019-07-16 NOTE — Telephone Encounter (Signed)
Spoke to pt and scheduled [facility] 08/08/19 11:30 CT ordered Nothing further needed

## 2019-08-06 ENCOUNTER — Other Ambulatory Visit: Payer: Self-pay | Admitting: *Deleted

## 2019-08-06 DIAGNOSIS — G4700 Insomnia, unspecified: Secondary | ICD-10-CM

## 2019-08-07 MED ORDER — ZOLPIDEM TARTRATE 5 MG PO TABS: 5.0000 mg | ORAL_TABLET | Freq: Every evening | ORAL | 0 refills | Status: DC | PRN

## 2019-08-08 ENCOUNTER — Ambulatory Visit (INDEPENDENT_AMBULATORY_CARE_PROVIDER_SITE_OTHER): Payer: Medicare Other

## 2019-08-08 ENCOUNTER — Other Ambulatory Visit: Payer: Self-pay

## 2019-08-08 ENCOUNTER — Encounter: Payer: Self-pay | Admitting: Acute Care

## 2019-08-08 ENCOUNTER — Ambulatory Visit (INDEPENDENT_AMBULATORY_CARE_PROVIDER_SITE_OTHER): Payer: Medicare Other | Admitting: Acute Care

## 2019-08-08 DIAGNOSIS — F17210 Nicotine dependence, cigarettes, uncomplicated: Secondary | ICD-10-CM

## 2019-08-08 DIAGNOSIS — Z122 Encounter for screening for malignant neoplasm of respiratory organs: Secondary | ICD-10-CM

## 2019-08-08 DIAGNOSIS — Z87891 Personal history of nicotine dependence: Secondary | ICD-10-CM | POA: Diagnosis not present

## 2019-08-08 NOTE — Patient Instructions (Signed)
Thank you for participating in the [facility]. It was our pleasure to meet you today. We will call you with the results of your scan within the next few days. Your scan will be assigned a Lung RADS category score by the physicians reading the scans.  This Lung RADS score determines follow up scanning.  See below for description of categories, and follow up screening recommendations. We will be in touch to schedule your follow up screening annually or based on recommendations of our providers. We will fax a copy of your scan results to your Primary Care Physician, or the physician who referred you to the program, to ensure they have the results. Please call the office if you have any questions or concerns regarding your scanning experience or results.  Our office number is [phone]. Please speak with Denise Phelps, RN. She is our Lung Cancer Screening RN. If she is unavailable when you call, please have the office staff send her a message. She will return your call at her earliest convenience. Remember, if your scan is normal, we will scan you annually as long as you continue to meet the criteria for the program. (Age 55-77, Current smoker or smoker who has quit within the last 15 years). If you are a smoker, remember, quitting is the single most powerful action that you can take to decrease your risk of lung cancer and other pulmonary, breathing related problems. We know quitting is hard, and we are here to help.  Please let us know if there is anything we can do to help you meet your goal of quitting. If you are a former smoker, congratulations. We are proud of you! Remain smoke free! Remember you can refer friends or family members through the number above.  We will screen them to make sure they meet criteria for the program. Thank you for helping us take better care of you by participating in Lung Screening.  Lung RADS Categories:  Lung RADS 1: no nodules  or definitely non-concerning nodules.  Recommendation is for a repeat annual scan in 12 months.  Lung RADS 2:  nodules that are non-concerning in appearance and behavior with a very low likelihood of becoming an active cancer. Recommendation is for a repeat annual scan in 12 months.  Lung RADS 3: nodules that are probably non-concerning , includes nodules with a low likelihood of becoming an active cancer.  Recommendation is for a 6-month repeat screening scan. Often noted after an upper respiratory illness. We will be in touch to make sure you have no questions, and to schedule your 6-month scan.  Lung RADS 4 A: nodules with concerning findings, recommendation is most often for a follow up scan in 3 months or additional testing based on our provider's assessment of the scan. We will be in touch to make sure you have no questions and to schedule the recommended 3 month follow up scan.  Lung RADS 4 B:  indicates findings that are concerning. We will be in touch with you to schedule additional diagnostic testing based on our provider's  assessment of the scan.   

## 2019-08-08 NOTE — Progress Notes (Signed)
Shared Decision Making Visit Lung Cancer Screening Program [phone])   Eligibility:  Age 64 y.o.  Pack Years Smoking History Calculation 30 pack year smoking history (# packs/per year x # years smoked)  Recent History of coughing up blood  no  Unexplained weight loss? no ( >Than 15 pounds within the last 6 months )  Prior History Lung / other cancer no (Diagnosis within the last 5 years already requiring surveillance chest CT Scans).  Smoking Status Current Smoker  Former Smokers: Years since quit: NA  Quit Date: NA  Visit Components:  Discussion included one or more decision making aids. yes  Discussion included risk/benefits of screening. yes  Discussion included potential follow up diagnostic testing for abnormal scans. yes  Discussion included meaning and risk of over diagnosis. yes  Discussion included meaning and risk of False Positives. yes  Discussion included meaning of total radiation exposure. yes  Counseling Included:  Importance of adherence to annual lung cancer LDCT screening. yes  Impact of comorbidities on ability to participate in the program. yes  Ability and willingness to under diagnostic treatment. yes  Smoking Cessation Counseling:  Current Smokers:   Discussed importance of smoking cessation. yes  Information about tobacco cessation classes and interventions provided to patient. yes  Patient provided with "ticket" for LDCT Scan. yes  Symptomatic Patient. no  Counseling NA  Diagnosis Code: Tobacco Use Z72.0  Asymptomatic Patient yes  Counseling (Intermediate counseling: > three minutes counseling) [id number]  Former Smokers:   Discussed the importance of maintaining cigarette abstinence. yes  Diagnosis Code: Personal History of Nicotine Dependence. M08.676  Information about tobacco cessation classes and interventions provided to patient. Yes  Patient provided with "ticket" for LDCT Scan. yes  Written Order for Lung Cancer  Screening with LDCT placed in Epic. Yes (CT Chest Lung Cancer Screening Low Dose W/O CM) [id number] Z12.2-Screening of respiratory organs Z87.891-Personal history of nicotine dependence  I have spent 25 minutes of face to face time with Ms. Mccuiston discussing the risks and benefits of lung cancer screening. We viewed a power point together that explained in detail the above noted topics. We paused at intervals to allow for questions to be asked and answered to ensure understanding.We discussed that the single most powerful action that she can take to decrease her risk of developing lung cancer is to quit smoking. We discussed whether or not she is ready to commit to setting a quit date. We discussed options for tools to aid in quitting smoking including nicotine replacement therapy, non-nicotine medications, support groups, Quit Smart classes, and behavior modification. We discussed that often times setting smaller, more achievable goals, such as eliminating 1 cigarette a day for a week and then 2 cigarettes a day for a week can be helpful in slowly decreasing the number of cigarettes smoked. This allows for a sense of accomplishment as well as providing a clinical benefit. I gave her the " Be Stronger Than Your Excuses" card with contact information for community resources, classes, free nicotine replacement therapy, and access to mobile apps, text messaging, and on-line smoking cessation help. I have also given her my card and contact information in the event she needs to contact me. We discussed the time and location of the scan, and that either Abigail Miyamoto RN or I will call with the results within 24-48 hours of receiving them. I have offered her  a copy of the power point we viewed  as a resource in the event they need reinforcement  of the concepts we discussed today in the office. The patient verbalized understanding of all of  the above and had no further questions upon leaving the office. They have my  contact information in the event they have any further questions.  I spent 3 minutes counseling on smoking cessation and the health risks of continued tobacco abuse.  I explained to the patient that there has been a high incidence of coronary artery disease noted on these exams. I explained that this is a non-gated exam therefore degree or severity cannot be determined. This patient is currently on statin therapy. I have asked the patient to follow-up with their PCP regarding any incidental finding of coronary artery disease and management with diet or medication as their PCP  feels is clinically indicated. The patient verbalized understanding of the above and had no further questions upon completion of the visit.  Pt. Will let us know if she would like a visit with pharmacy for smoking cessation.     Bevelyn Ngo, NP 08/08/2019 12:02 PM

## 2019-08-09 ENCOUNTER — Encounter: Payer: Self-pay | Admitting: Family Medicine

## 2019-08-09 NOTE — Progress Notes (Signed)
Please call patient and let them  know their  low dose Ct was read as a Lung RADS 2: nodules that are benign in appearance and behavior with a very low likelihood of becoming a clinically active cancer due to size or lack of growth. Recommendation per radiology is for a repeat LDCT in 12 months. .Please let them  know we will order and schedule their  annual screening scan for 07/2020. Please let them  know there was notation of CAD on their  scan.  Please remind the patient  that this is a non-gated exam therefore degree or severity of disease  cannot be determined. Please have them  follow up with their PCP regarding potential risk factor modification, dietary therapy or pharmacologic therapy if clinically indicated. Pt.  is currently  currently on statin therapy. Please place order for annual  screening scan for 07/2020 and fax results to PCP. Thanks so much.

## 2019-08-13 ENCOUNTER — Encounter: Payer: Self-pay | Admitting: Family Medicine

## 2019-08-13 ENCOUNTER — Other Ambulatory Visit: Payer: Self-pay | Admitting: *Deleted

## 2019-08-13 DIAGNOSIS — F17210 Nicotine dependence, cigarettes, uncomplicated: Secondary | ICD-10-CM

## 2019-08-13 DIAGNOSIS — J439 Emphysema, unspecified: Secondary | ICD-10-CM | POA: Insufficient documentation

## 2019-08-13 DIAGNOSIS — I700 Atherosclerosis of aorta: Secondary | ICD-10-CM | POA: Insufficient documentation

## 2019-08-13 DIAGNOSIS — Z87891 Personal history of nicotine dependence: Secondary | ICD-10-CM

## 2019-08-17 NOTE — Telephone Encounter (Signed)
Most recent result is CT lung. Looks like pulmonology called and discussed results with patient directly.

## 2019-09-16 ENCOUNTER — Encounter: Payer: Self-pay | Admitting: Family Medicine

## 2019-09-17 ENCOUNTER — Other Ambulatory Visit: Payer: Self-pay | Admitting: *Deleted

## 2019-09-17 ENCOUNTER — Encounter: Payer: Self-pay | Admitting: Family Medicine

## 2019-09-17 DIAGNOSIS — E119 Type 2 diabetes mellitus without complications: Secondary | ICD-10-CM

## 2019-09-17 MED ORDER — ACCU-CHEK GUIDE VI STRP
ORAL_STRIP | 4 refills | Status: DC
Start: 2019-09-17 — End: 2020-10-07

## 2019-10-26 ENCOUNTER — Encounter: Payer: Self-pay | Admitting: Family Medicine

## 2019-10-26 ENCOUNTER — Ambulatory Visit (INDEPENDENT_AMBULATORY_CARE_PROVIDER_SITE_OTHER): Payer: Medicare Other | Admitting: Family Medicine

## 2019-10-26 VITALS — BP 120/65 | HR 93 | Ht 63.0 in | Wt 135.0 lb

## 2019-10-26 DIAGNOSIS — E119 Type 2 diabetes mellitus without complications: Secondary | ICD-10-CM | POA: Diagnosis not present

## 2019-10-26 DIAGNOSIS — I10 Essential (primary) hypertension: Secondary | ICD-10-CM

## 2019-10-26 DIAGNOSIS — R197 Diarrhea, unspecified: Secondary | ICD-10-CM

## 2019-10-26 DIAGNOSIS — R1013 Epigastric pain: Secondary | ICD-10-CM

## 2019-10-26 NOTE — Assessment & Plan Note (Signed)
His A1c looks great at 6.0.  She is actually lost more weight since I last saw her.  Still can hold off on finding a substitute for the Onglyza for now and see what her A1c is if it is elevated then will need to find a new medication for her to take otherwise continue with Metformin for now.  She is also on an ACE inhibitor and statin.

## 2019-10-26 NOTE — Assessment & Plan Note (Signed)
Well controlled. Continue current regimen. Follow up in  3-4 mo  

## 2019-10-26 NOTE — Progress Notes (Signed)
Established Patient Office Visit  Subjective:  Patient ID: Krista Love, female    DOB: 1955/09/24  Age: 64 y.o. MRN: [id number]  CC:  Chief Complaint  Patient presents with  . Abdominal Pain    HPI Jordin Dambrosio [facility] presents for stomach issues.  She says for about 2 to 3 weeks she has had intermittent upper abdominal pain which she feels like a "hard rock" most like a tightness.  It goes over the right and left upper quadrants it usually last about 1 to 2 hours.  She says it is bad enough that she usually goes and lays down she can but then it does finally ease off.  She has had a decreased appetite as well as some intermittent nausea especially when the pain occurs.  She has noticed that her stools have been more liquidy and forceful at times though she says this is actually been going on a little bit longer.  Some stools are almost black-colored and some are more green she has not noticed any blood she is also just felt more fatigued over the last 2 to 3 weeks with no rash etc.  She does still have her gallbladder she has never had pancreatitis before.  She denies any new medications or over-the-counter supplements etc.  She actually ran out of her onglyza couple of weeks ago as it is no longer covered with her current insurance plan so has actually been off of that.    Past Medical History:  Diagnosis Date  . Diabetes mellitus   . Hyperlipidemia   . Hypertension     Past Surgical History:  Procedure Laterality Date  . back surgery for a ruptured disc      Family History  Problem Relation Age of Onset  . Hypertension Mother   . Diabetes Brother   . Cancer Maternal Grandfather        skin  . Stomach cancer Sister   . Heart disease Sister 74       stents.     Social History   Socioeconomic History  . Marital status: Divorced    Spouse name: Not on file  . Number of children: 1  . Years of education: Not on file  . Highest education level: Not on file  Occupational  History  . 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 Smoking status: Current Every Day Smoker    Packs/day: 0.50    Types: Cigarettes  . Smokeless tobacco: Never Used  Substance and Sexual Activity  . Alcohol use: Yes  . Drug use: No  . Sexual activity: Not Currently  Other Topics Concern  . Not on file  Social History Narrative   No regular exercise. 2 cups of coffee a days.    Social Determinants of Health   Financial Resource Strain:   . Difficulty of Paying Living Expenses: Not on file  Food Insecurity:   . Worried About Programme researcher, broadcasting/film/video in the Last Year: Not on file  . Ran Out of Food in the Last Year: Not on file  Transportation Needs:   . Lack of Transportation (Medical): Not on file  . Lack of Transportation (Non-Medical): Not on file  Physical Activity:   . Days of Exercise per Week: Not on file  . Minutes of Exercise per Session: Not on file  Stress:   . Feeling of Stress : Not on file  Social Connections:   .  Frequency of Communication with Friends and Family: Not on file  . Frequency of Social Gatherings with Friends and Family: Not on file  . Attends Religious Services: Not on file  . Active Member of Clubs or Organizations: Not on file  . Attends Banker Meetings: Not on file  . Marital Status: Not on file  Intimate Partner Violence:   . Fear of Current or Ex-Partner: Not on file  . Emotionally Abused: Not on file  . Physically Abused: Not on file  . Sexually Abused: Not on file    Outpatient Medications Prior to Visit  Medication Sig Dispense Refill  . glucose blood (ACCU-CHEK GUIDE) test strip For checking blood sugar every morning and 2 hours after largest meal of the day 3 times a week.Dx:E11.9 200 each 4  . lisinopril (ZESTRIL) 20 MG tablet Take 1 tablet (20 mg total) by mouth daily. 90 tablet 3  . metFORMIN (GLUCOPHAGE) 1000 MG tablet TAKE 1 TABLET BY MOUTH  TWICE DAILY WITH A MEAL 180  tablet 3  . Microlet Lancets MISC TEST FOUR TIMES DAILY 200 each 11  . NON FORMULARY CBD Oil    . rosuvastatin (CRESTOR) 40 MG tablet Take 1 tablet (40 mg total) by mouth daily. 90 tablet 3  . venlafaxine XR (EFFEXOR-XR) 37.5 MG 24 hr capsule TAKE 1 CAPSULE BY MOUTH  DAILY WITH BREAKFAST 90 capsule 3  . zolpidem (AMBIEN) 5 MG tablet Take 1 tablet (5 mg total) by mouth at bedtime as needed. for sleep 90 tablet 0  . saxagliptin HCl (ONGLYZA) 2.5 MG TABS tablet Take 1 tablet (2.5 mg total) by mouth daily. 90 tablet 0   No facility-administered medications prior to visit.    Allergies  Allergen Reactions  . Codeine     ROS Review of Systems    Objective:    Physical Exam Constitutional:      Appearance: She is well-developed.  HENT:     Head: Normocephalic and atraumatic.  Cardiovascular:     Rate and Rhythm: Normal rate and regular rhythm.     Heart sounds: Normal heart sounds.  Pulmonary:     Effort: Pulmonary effort is normal.     Breath sounds: Normal breath sounds.  Abdominal:     General: Abdomen is flat. Bowel sounds are normal.     Palpations: Abdomen is soft. There is no mass.     Tenderness: There is no abdominal tenderness. There is no guarding.  Skin:    General: Skin is warm and dry.  Neurological:     Mental Status: She is alert and oriented to person, place, and time.  Psychiatric:        Behavior: Behavior normal.     BP 120/65   Pulse 93   Ht 5\' 3"  (1.6 m)   Wt 135 lb (61.2 kg)   SpO2 98%   BMI 23.91 kg/m  Wt Readings from Last 3 Encounters:  10/26/19 135 lb (61.2 kg)  05/11/19 137 lb (62.1 kg)  12/13/18 147 lb (66.7 kg)     Health Maintenance Due  Topic Date Due  . PNA vac Low Risk Adult (1 of 2 - PCV13) 04/24/2018  . FOOT EXAM  10/07/2018    There are no preventive care reminders to display for this patient.  Lab Results  Component Value Date   TSH 2.858 03/18/2010   Lab Results  Component Value Date   WBC 6.2 03/13/2018   HGB  12.9 03/13/2018   HCT  38.5 03/13/2018   MCV 91.0 03/13/2018   PLT 295 03/13/2018   Lab Results  Component Value Date   NA 137 04/30/2019   K 4.7 04/30/2019   CO2 28 04/30/2019   GLUCOSE 106 (H) 04/30/2019   BUN 17 04/30/2019   CREATININE 0.83 04/30/2019   BILITOT 0.4 04/30/2019   ALKPHOS 74 02/25/2016   AST 15 04/30/2019   ALT 22 04/30/2019   PROT 6.6 04/30/2019   ALBUMIN 4.3 02/25/2016   CALCIUM 9.6 04/30/2019   Lab Results  Component Value Date   CHOL 205 (H) 04/30/2019   Lab Results  Component Value Date   HDL 71 04/30/2019   Lab Results  Component Value Date   LDLCALC 107 (H) 04/30/2019   Lab Results  Component Value Date   TRIG 154 (H) 04/30/2019   Lab Results  Component Value Date   CHOLHDL 2.9 04/30/2019   Lab Results  Component Value Date   HGBA1C 6.0 (H) 04/30/2019      Assessment & Plan:   Problem List Items Addressed This Visit      Cardiovascular and Mediastinum   HYPERTENSION, BENIGN    Well controlled. Continue current regimen. Follow up in  3-4 mo        Endocrine   Diabetes type 2, controlled (HCC)    His A1c looks great at 6.0.  She is actually lost more weight since I last saw her.  Still can hold off on finding a substitute for the Onglyza for now and see what her A1c is if it is elevated then will need to find a new medication for her to take otherwise continue with Metformin for now.  She is also on an ACE inhibitor and statin.       Other Visit Diagnoses    Epigastric pain    -  Primary   Relevant Orders   CBC   COMPLETE METABOLIC PANEL WITH GFR   TSH   Hemoglobin A1c   Lipase   Stool culture   Clostridium difficile Toxin B, Qualitative, Real-Time PCR   Diarrhea, unspecified type       Relevant Orders   CBC   COMPLETE METABOLIC PANEL WITH GFR   TSH   Hemoglobin A1c   Lipase   Stool culture   Clostridium difficile Toxin B, Qualitative, Real-Time PCR      Gastric pain/diarrhea-unclear etiology has been going on  for about 2 weeks at this point.  No severe abdominal pain or vomiting or blood in the stool which is reassuring.  Will get labs to evaluate for anemia, liver function, thyroid, and pancreatitis.  Will call with results once available we will get stool culture and C. difficile for the diarrhea.  If all negative and reassuring the consider further work-up with CT of abdomen and pelvis.  No orders of the defined types were placed in this encounter.   Follow-up: Return in about 2 weeks (around 11/09/2019) for abdominal pain.    Nani Gasser, MD

## 2019-10-27 LAB — COMPLETE METABOLIC PANEL WITH GFR
AG Ratio: 2.1 (calc) (ref 1.0–2.5)
ALT: 25 U/L (ref 6–29)
AST: 16 U/L (ref 10–35)
Albumin: 4.6 g/dL (ref 3.6–5.1)
Alkaline phosphatase (APISO): 80 U/L (ref 37–153)
BUN: 9 mg/dL (ref 7–25)
CO2: 28 mmol/L (ref 20–32)
Calcium: 10.5 mg/dL — ABNORMAL HIGH (ref 8.6–10.4)
Chloride: 102 mmol/L (ref 98–110)
Creat: 0.76 mg/dL (ref 0.50–0.99)
GFR, Est African American: 95 mL/min/{1.73_m2} (ref >=60)
GFR, Est Non African American: 82 mL/min/{1.73_m2} (ref >=60)
Globulin: 2.2 g/dL (calc) (ref 1.9–3.7)
Glucose, Bld: 96 mg/dL (ref 65–139)
Potassium: 5.3 mmol/L (ref 3.5–5.3)
Sodium: 139 mmol/L (ref 135–146)
Total Bilirubin: 0.3 mg/dL (ref 0.2–1.2)
Total Protein: 6.8 g/dL (ref 6.1–8.1)

## 2019-10-27 LAB — CBC
HCT: 41.5 % (ref 35.0–45.0)
Hemoglobin: 13.7 g/dL (ref 11.7–15.5)
MCH: 30.4 pg (ref 27.0–33.0)
MCHC: 33 g/dL (ref 32.0–36.0)
MCV: 92 fL (ref 80.0–100.0)
MPV: 8.6 fL (ref 7.5–12.5)
Platelets: 373 10*3/uL (ref 140–400)
RBC: 4.51 10*6/uL (ref 3.80–5.10)
RDW: 13.1 % (ref 11.0–15.0)
WBC: 7.4 10*3/uL (ref 3.8–10.8)

## 2019-10-27 LAB — HEMOGLOBIN A1C
Hgb A1c MFr Bld: 6 % of total Hgb — ABNORMAL HIGH (ref <5.7)
Mean Plasma Glucose: 126 (calc)
eAG (mmol/L): 7 (calc)

## 2019-10-27 LAB — TSH: TSH: 2.16 mIU/L (ref 0.40–4.50)

## 2019-10-27 LAB — LIPASE: Lipase: 42 U/L (ref 7–60)

## 2019-10-28 ENCOUNTER — Other Ambulatory Visit: Payer: Self-pay | Admitting: Family Medicine

## 2019-10-28 DIAGNOSIS — G4700 Insomnia, unspecified: Secondary | ICD-10-CM

## 2019-10-31 ENCOUNTER — Encounter: Payer: Self-pay | Admitting: Family Medicine

## 2019-10-31 DIAGNOSIS — R1013 Epigastric pain: Secondary | ICD-10-CM | POA: Diagnosis not present

## 2019-10-31 DIAGNOSIS — R197 Diarrhea, unspecified: Secondary | ICD-10-CM | POA: Diagnosis not present

## 2019-11-01 ENCOUNTER — Encounter: Payer: Self-pay | Admitting: Family Medicine

## 2019-11-01 DIAGNOSIS — R197 Diarrhea, unspecified: Secondary | ICD-10-CM

## 2019-11-01 DIAGNOSIS — R1013 Epigastric pain: Secondary | ICD-10-CM

## 2019-11-03 LAB — SALMONELLA/SHIGELLA CULT, CAMPY EIA AND SHIGA TOXIN RFL ECOLI
MICRO NUMBER: 11070713
MICRO NUMBER:: 11070714
MICRO NUMBER:: 11070715
Result:: NOT DETECTED
SHIGA RESULT:: NOT DETECTED
SPECIMEN QUALITY: ADEQUATE
SPECIMEN QUALITY:: ADEQUATE
SPECIMEN QUALITY:: ADEQUATE

## 2019-11-03 LAB — CLOSTRIDIUM DIFFICILE TOXIN B, QUALITATIVE, REAL-TIME PCR: Toxigenic C. Difficile by PCR: NOT DETECTED

## 2019-11-12 ENCOUNTER — Other Ambulatory Visit: Payer: Self-pay

## 2019-11-12 ENCOUNTER — Ambulatory Visit (INDEPENDENT_AMBULATORY_CARE_PROVIDER_SITE_OTHER): Payer: Medicare Other

## 2019-11-12 DIAGNOSIS — R109 Unspecified abdominal pain: Secondary | ICD-10-CM | POA: Diagnosis not present

## 2019-11-12 DIAGNOSIS — R1013 Epigastric pain: Secondary | ICD-10-CM

## 2019-11-12 DIAGNOSIS — R1030 Lower abdominal pain, unspecified: Secondary | ICD-10-CM

## 2019-11-12 DIAGNOSIS — R197 Diarrhea, unspecified: Secondary | ICD-10-CM

## 2019-11-12 MED ORDER — IOHEXOL 300 MG/ML  SOLN
100.0000 mL | Freq: Once | INTRAMUSCULAR | Status: AC | PRN
  Administered 2019-11-12: 100 mL via INTRAVENOUS

## 2019-11-13 ENCOUNTER — Encounter: Payer: Self-pay | Admitting: Family Medicine

## 2019-11-13 ENCOUNTER — Encounter: Payer: Self-pay | Admitting: Gastroenterology

## 2019-11-13 ENCOUNTER — Ambulatory Visit (INDEPENDENT_AMBULATORY_CARE_PROVIDER_SITE_OTHER): Payer: Medicare Other | Admitting: Family Medicine

## 2019-11-13 VITALS — BP 121/57 | HR 94 | Ht 63.0 in | Wt 137.0 lb

## 2019-11-13 DIAGNOSIS — R1084 Generalized abdominal pain: Secondary | ICD-10-CM | POA: Diagnosis not present

## 2019-11-13 NOTE — Progress Notes (Signed)
She reports that her stools are mostly watery with some solid pieces. The episodes begin upon waking until the afternoon. She continues to experience gas pain, fecal urgency,and bloating. the pain starts above her belly button and goes down to her lower abdomen. She stated that it doesn't matter what she eats it causes gas pain and diarrhea.

## 2019-11-13 NOTE — Progress Notes (Signed)
Established Patient Office Visit  Subjective:  Patient ID: Krista Love, female    DOB: 1955/05/01  Age: 64 y.o. MRN: [id number]  CC:  Chief Complaint  Patient presents with  . Follow-up    [facility] presents follow-up of diarrhea and abdominal pain.  She reports that her stools are mostly watery with some solid pieces. The episodes begin upon waking until the afternoon. She continues to experience gas pain, fecal urgency,and bloating. the pain starts above her belly button and goes down to her lower abdomen. She stated that it doesn't matter what she eats it causes gas pain and diarrhea.    She did go for CAT scan yesterday and she also cut back on her Metformin.   Stool culture was negative.  Negative for C. difficile.  Lipase was normal.  Globin A1c was stable in the prediabetes range.  Liver enzymes were normal.  Instead of the pain being more epigastric it now just feels more generalized.  She has some intermittent nausea but not frequent no vomiting.  No blood in the stool.  She has noticed a decreased appetite and some increased fatigue.  He has been splitting the Metformin but says it really is not helping the diarrhea.  She is not tried anything over-the-counter for it.  Past Medical History:  Diagnosis Date  . Diabetes mellitus   . Hyperlipidemia   . Hypertension     Past Surgical History:  Procedure Laterality Date  . back surgery for a ruptured disc      Family History  Problem Relation Age of Onset  . Hypertension Mother   . Diabetes Brother   . Cancer Maternal Grandfather        skin  . Stomach cancer Sister   . Heart disease Sister 72       stents.     Social History   Socioeconomic History  . Marital status: Divorced    Spouse name: Not on file  . Number of children: 1  . Years of education: Not on file  . Highest education level: Not on file  Occupational History  . 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 Smoking status: Current Every Day Smoker    Packs/day: 0.50    Types: Cigarettes  . Smokeless tobacco: Never Used  Substance and Sexual Activity  . Alcohol use: Yes  . Drug use: No  . Sexual activity: Not Currently  Other Topics Concern  . Not on file  Social History Narrative   No regular exercise. 2 cups of coffee a days.    Social Determinants of Health   Financial Resource Strain:   . Difficulty of Paying Living Expenses: Not on file  Food Insecurity:   . Worried About Programme researcher, broadcasting/film/video in the Last Year: Not on file  . Ran Out of Food in the Last Year: Not on file  Transportation Needs:   . Lack of Transportation (Medical): Not on file  . Lack of Transportation (Non-Medical): Not on file  Physical Activity:   . Days of Exercise per Week: Not on file  . Minutes of Exercise per Session: Not on file  Stress:   . Feeling of Stress : Not on file  Social Connections:   . Frequency of Communication with Friends and Family: Not on file  . Frequency of Social Gatherings with Friends and Family: Not on file  . Attends Religious Services: Not  on file  . Active Member of Clubs or Organizations: Not on file  . Attends Banker Meetings: Not on file  . Marital Status: Not on file  Intimate Partner Violence:   . Fear of Current or Ex-Partner: Not on file  . Emotionally Abused: Not on file  . Physically Abused: Not on file  . Sexually Abused: Not on file    Outpatient Medications Prior to Visit  Medication Sig Dispense Refill  . glucose blood (ACCU-CHEK GUIDE) test strip For checking blood sugar every morning and 2 hours after largest meal of the day 3 times a week.Dx:E11.9 200 each 4  . lisinopril (ZESTRIL) 20 MG tablet Take 1 tablet (20 mg total) by mouth daily. 90 tablet 3  . metFORMIN (GLUCOPHAGE) 1000 MG tablet TAKE 1 TABLET BY MOUTH  TWICE DAILY WITH A MEAL 180 tablet 3  . Microlet Lancets MISC TEST FOUR TIMES DAILY 200 each 11  . rosuvastatin  (CRESTOR) 40 MG tablet Take 1 tablet (40 mg total) by mouth daily. 90 tablet 3  . venlafaxine XR (EFFEXOR-XR) 37.5 MG 24 hr capsule TAKE 1 CAPSULE BY MOUTH  DAILY WITH BREAKFAST 90 capsule 3  . zolpidem (AMBIEN) 5 MG tablet TAKE 1 TABLET(5 MG) BY MOUTH AT BEDTIME AS NEEDED FOR SLEEP 90 tablet 0  . NON FORMULARY CBD Oil     No facility-administered medications prior to visit.    Allergies  Allergen Reactions  . Codeine     ROS Review of Systems    Objective:    Physical Exam Constitutional:      Appearance: She is well-developed.  HENT:     Head: Normocephalic and atraumatic.  Cardiovascular:     Rate and Rhythm: Normal rate and regular rhythm.     Heart sounds: Normal heart sounds.  Pulmonary:     Effort: Pulmonary effort is normal.     Breath sounds: Normal breath sounds.  Abdominal:     General: Abdomen is flat. Bowel sounds are normal.     Palpations: Abdomen is soft.     Tenderness: There is abdominal tenderness. There is no guarding or rebound.     Comments: Mild generalized tenderness.  Skin:    General: Skin is warm and dry.  Neurological:     Mental Status: She is alert and oriented to person, place, and time.  Psychiatric:        Behavior: Behavior normal.     BP (!) 121/57   Pulse 94   Ht 5\' 3"  (1.6 m)   Wt 137 lb (62.1 kg)   SpO2 100%   BMI 24.27 kg/m  Wt Readings from Last 3 Encounters:  11/13/19 137 lb (62.1 kg)  10/26/19 135 lb (61.2 kg)  05/11/19 137 lb (62.1 kg)     There are no preventive care reminders to display for this patient.  There are no preventive care reminders to display for this patient.  Lab Results  Component Value Date   TSH 2.16 10/26/2019   Lab Results  Component Value Date   WBC 7.4 10/26/2019   HGB 13.7 10/26/2019   HCT 41.5 10/26/2019   MCV 92.0 10/26/2019   PLT 373 10/26/2019   Lab Results  Component Value Date   NA 139 10/26/2019   K 5.3 10/26/2019   CO2 28 10/26/2019   GLUCOSE 96 10/26/2019   BUN 9  10/26/2019   CREATININE 0.76 10/26/2019   BILITOT 0.3 10/26/2019   ALKPHOS 74 02/25/2016   AST  16 10/26/2019   ALT 25 10/26/2019   PROT 6.8 10/26/2019   ALBUMIN 4.3 02/25/2016   CALCIUM 10.5 (H) 10/26/2019   Lab Results  Component Value Date   CHOL 205 (H) 04/30/2019   Lab Results  Component Value Date   HDL 71 04/30/2019   Lab Results  Component Value Date   LDLCALC 107 (H) 04/30/2019   Lab Results  Component Value Date   TRIG 154 (H) 04/30/2019   Lab Results  Component Value Date   CHOLHDL 2.9 04/30/2019   Lab Results  Component Value Date   HGBA1C 6.0 (H) 10/26/2019      Assessment & Plan:   Problem List Items Addressed This Visit    None    Visit Diagnoses    Generalized abdominal pain    -  Primary     Generalized abdominal pain with persistent diarrhea-negative stool cultures and C. difficile.  No evidence of pancreatitis.  Still awaiting CT results.  Recommend a trial of Imodium for symptom relief.  Have results back later today.  If negative consider GI referral.  Hold metformin completely for now.   No orders of the defined types were placed in this encounter.   Follow-up: Return if symptoms worsen or fail to improve.    Nani Gasser, MD

## 2019-11-13 NOTE — Patient Instructions (Signed)
Ok to try Immodium.

## 2019-11-20 ENCOUNTER — Encounter: Payer: Self-pay | Admitting: Family Medicine

## 2019-11-20 NOTE — Telephone Encounter (Signed)
I sent referral to [facility] to see if they can get patient in sooner. - CF

## 2019-11-21 ENCOUNTER — Encounter: Payer: Self-pay | Admitting: Family Medicine

## 2019-11-22 DIAGNOSIS — R1030 Lower abdominal pain, unspecified: Secondary | ICD-10-CM | POA: Diagnosis not present

## 2019-11-22 DIAGNOSIS — R935 Abnormal findings on diagnostic imaging of other abdominal regions, including retroperitoneum: Secondary | ICD-10-CM | POA: Diagnosis not present

## 2019-11-22 DIAGNOSIS — R1013 Epigastric pain: Secondary | ICD-10-CM | POA: Diagnosis not present

## 2019-11-22 DIAGNOSIS — R197 Diarrhea, unspecified: Secondary | ICD-10-CM | POA: Diagnosis not present

## 2019-11-22 NOTE — Telephone Encounter (Signed)
fyi

## 2019-11-23 ENCOUNTER — Encounter: Payer: Self-pay | Admitting: Family Medicine

## 2019-11-29 DIAGNOSIS — K52832 Lymphocytic colitis: Secondary | ICD-10-CM | POA: Diagnosis not present

## 2019-11-29 DIAGNOSIS — K5730 Diverticulosis of large intestine without perforation or abscess without bleeding: Secondary | ICD-10-CM | POA: Diagnosis not present

## 2019-11-29 DIAGNOSIS — K648 Other hemorrhoids: Secondary | ICD-10-CM | POA: Diagnosis not present

## 2019-11-29 DIAGNOSIS — R197 Diarrhea, unspecified: Secondary | ICD-10-CM | POA: Diagnosis not present

## 2019-11-29 LAB — HM COLONOSCOPY

## 2019-12-18 ENCOUNTER — Ambulatory Visit: Payer: Medicare Other | Admitting: Gastroenterology

## 2020-01-29 DIAGNOSIS — K52832 Lymphocytic colitis: Secondary | ICD-10-CM | POA: Diagnosis not present

## 2020-01-29 DIAGNOSIS — R1013 Epigastric pain: Secondary | ICD-10-CM | POA: Diagnosis not present

## 2020-01-29 DIAGNOSIS — R1030 Lower abdominal pain, unspecified: Secondary | ICD-10-CM | POA: Diagnosis not present

## 2020-01-29 DIAGNOSIS — R935 Abnormal findings on diagnostic imaging of other abdominal regions, including retroperitoneum: Secondary | ICD-10-CM | POA: Diagnosis not present

## 2020-01-29 DIAGNOSIS — R197 Diarrhea, unspecified: Secondary | ICD-10-CM | POA: Diagnosis not present

## 2020-01-31 ENCOUNTER — Other Ambulatory Visit: Payer: Self-pay

## 2020-01-31 ENCOUNTER — Encounter: Payer: Self-pay | Admitting: Family Medicine

## 2020-01-31 DIAGNOSIS — G4700 Insomnia, unspecified: Secondary | ICD-10-CM

## 2020-01-31 MED ORDER — ZOLPIDEM TARTRATE 5 MG PO TABS: ORAL_TABLET | ORAL | 0 refills | Status: DC

## 2020-01-31 NOTE — Telephone Encounter (Signed)
Med sent to pharmacy.

## 2020-01-31 NOTE — Telephone Encounter (Signed)
Please advice  

## 2020-02-04 ENCOUNTER — Encounter: Payer: Self-pay | Admitting: Family Medicine

## 2020-02-10 ENCOUNTER — Other Ambulatory Visit: Payer: Self-pay | Admitting: Family Medicine

## 2020-02-10 DIAGNOSIS — I10 Essential (primary) hypertension: Secondary | ICD-10-CM

## 2020-02-10 DIAGNOSIS — E785 Hyperlipidemia, unspecified: Secondary | ICD-10-CM

## 2020-03-25 ENCOUNTER — Encounter: Payer: Self-pay | Admitting: Family Medicine

## 2020-03-26 ENCOUNTER — Encounter: Payer: Self-pay | Admitting: Family Medicine

## 2020-04-16 ENCOUNTER — Other Ambulatory Visit: Payer: Self-pay | Admitting: Family Medicine

## 2020-04-18 IMAGING — MG DIGITAL SCREENING BILATERAL MAMMOGRAM WITH TOMO AND CAD
3 series · 3 of 15 positions shown · non-contrast
Comparison: Previous exam(s).

CLINICAL DATA: Screening.

EXAM:
DIGITAL SCREENING BILATERAL MAMMOGRAM WITH TOMO AND CAD

[L CC tomo · tomo slice 29/58.0]
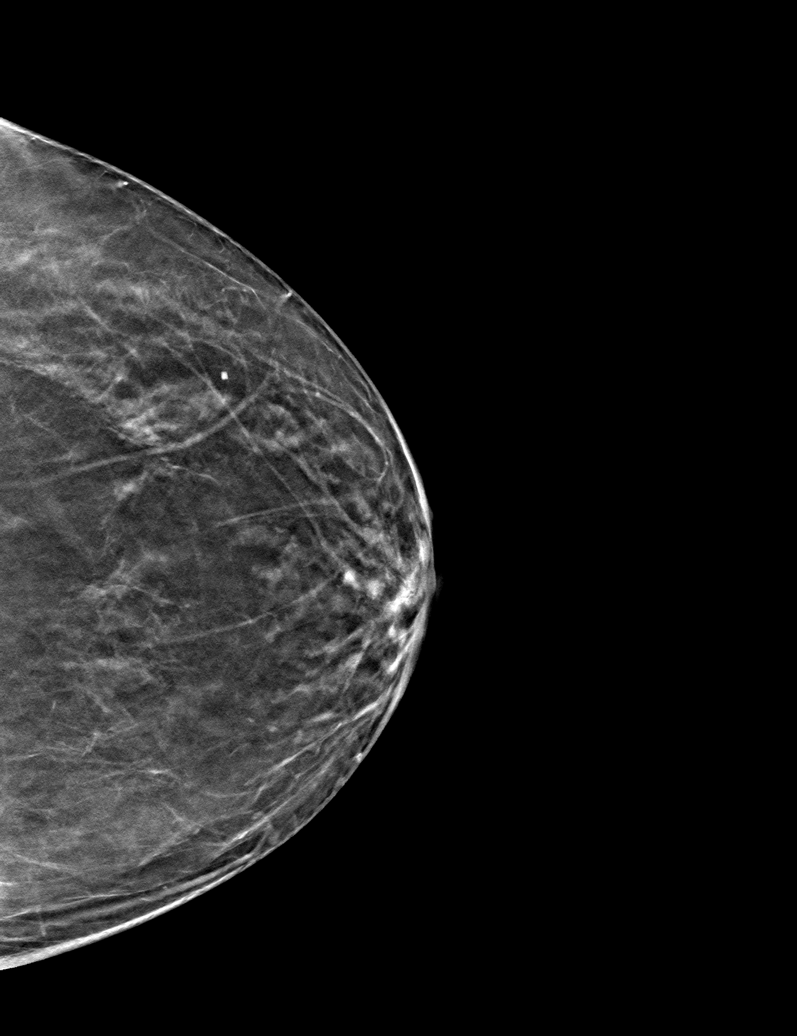

[R MLO tomo · tomo slice 35/68.0]
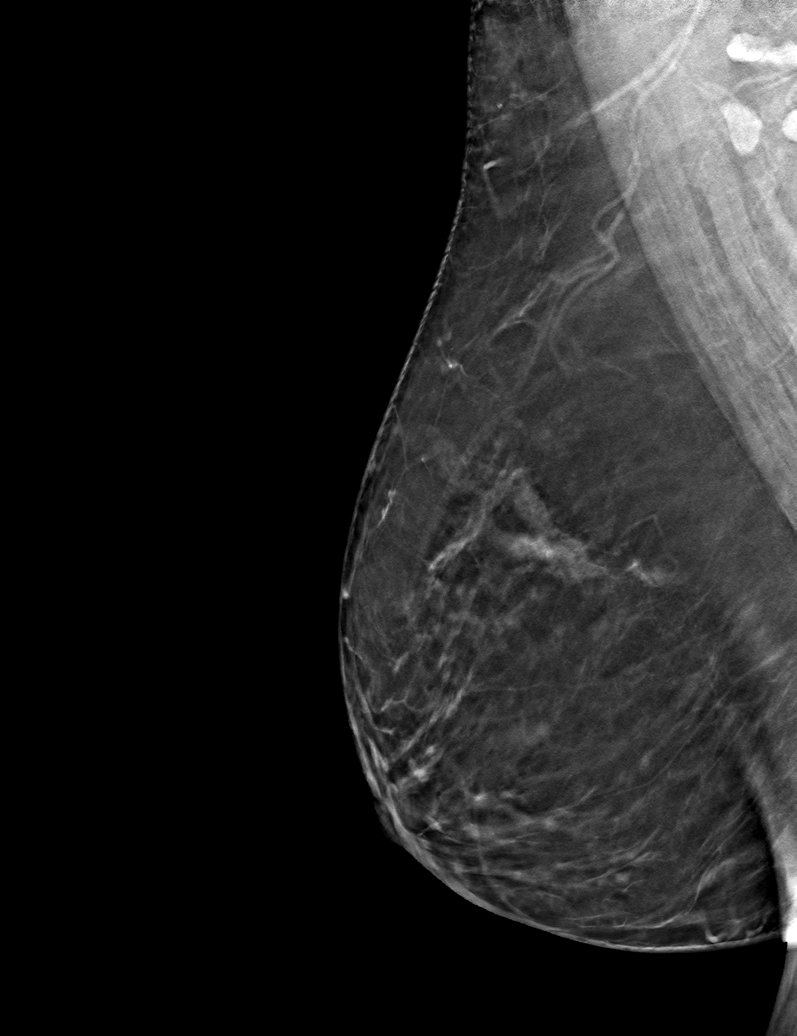

[R CC tomo · tomo slice 31/61.0]
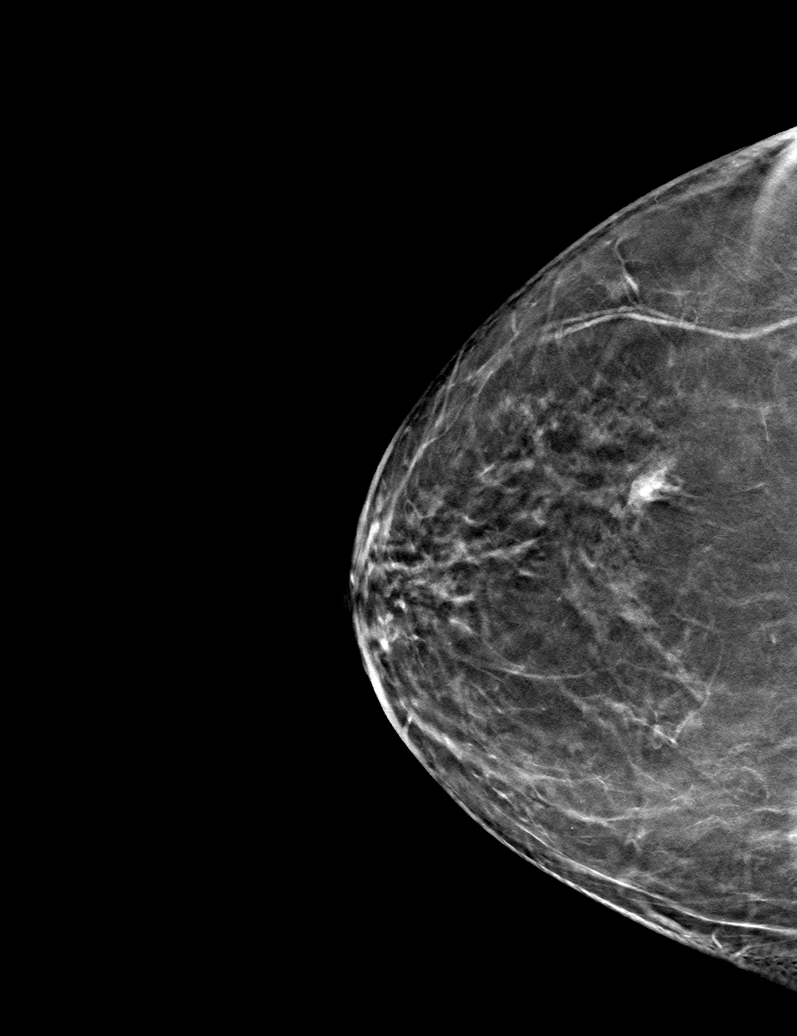

[3 of 15 positions shown; findings below may reference images not displayed]

ACR Breast Density Category b: There are scattered areas of
fibroglandular density.
FINDINGS: There are no findings suspicious for malignancy. Images were
processed with CAD.
IMPRESSION: No mammographic evidence of malignancy. A result letter of this
screening mammogram will be mailed directly to the patient.

RECOMMENDATION:
Screening mammogram in one year. (Code:CN-U-775)

BI-RADS CATEGORY  1: Negative.

## 2020-04-28 ENCOUNTER — Other Ambulatory Visit: Payer: Self-pay | Admitting: Family Medicine

## 2020-04-28 DIAGNOSIS — G4700 Insomnia, unspecified: Secondary | ICD-10-CM

## 2020-04-29 ENCOUNTER — Encounter: Payer: Self-pay | Admitting: Family Medicine

## 2020-04-29 DIAGNOSIS — G4700 Insomnia, unspecified: Secondary | ICD-10-CM

## 2020-04-29 MED ORDER — ZOLPIDEM TARTRATE 5 MG PO TABS
ORAL_TABLET | ORAL | 0 refills | Status: DC
Start: 2020-04-29 — End: 2020-05-27

## 2020-05-05 ENCOUNTER — Other Ambulatory Visit: Payer: Self-pay | Admitting: Family Medicine

## 2020-05-05 DIAGNOSIS — E119 Type 2 diabetes mellitus without complications: Secondary | ICD-10-CM

## 2020-05-13 ENCOUNTER — Encounter: Payer: Medicare Other | Admitting: Family Medicine

## 2020-05-20 DIAGNOSIS — E119 Type 2 diabetes mellitus without complications: Secondary | ICD-10-CM | POA: Diagnosis not present

## 2020-05-20 LAB — HM DIABETES EYE EXAM

## 2020-05-27 ENCOUNTER — Ambulatory Visit (INDEPENDENT_AMBULATORY_CARE_PROVIDER_SITE_OTHER): Payer: Medicare Other | Admitting: Family Medicine

## 2020-05-27 ENCOUNTER — Other Ambulatory Visit: Payer: Self-pay

## 2020-05-27 ENCOUNTER — Encounter: Payer: Self-pay | Admitting: Family Medicine

## 2020-05-27 VITALS — BP 130/73 | HR 92 | Ht 63.0 in | Wt 129.0 lb

## 2020-05-27 DIAGNOSIS — E785 Hyperlipidemia, unspecified: Secondary | ICD-10-CM | POA: Diagnosis not present

## 2020-05-27 DIAGNOSIS — Z1231 Encounter for screening mammogram for malignant neoplasm of breast: Secondary | ICD-10-CM | POA: Diagnosis not present

## 2020-05-27 DIAGNOSIS — Z0000 Encounter for general adult medical examination without abnormal findings: Secondary | ICD-10-CM | POA: Diagnosis not present

## 2020-05-27 DIAGNOSIS — I10 Essential (primary) hypertension: Secondary | ICD-10-CM | POA: Diagnosis not present

## 2020-05-27 DIAGNOSIS — Z23 Encounter for immunization: Secondary | ICD-10-CM | POA: Diagnosis not present

## 2020-05-27 DIAGNOSIS — G4700 Insomnia, unspecified: Secondary | ICD-10-CM | POA: Diagnosis not present

## 2020-05-27 DIAGNOSIS — E119 Type 2 diabetes mellitus without complications: Secondary | ICD-10-CM | POA: Diagnosis not present

## 2020-05-27 MED ORDER — ZOLPIDEM TARTRATE 5 MG PO TABS
ORAL_TABLET | ORAL | 0 refills | Status: DC
Start: 2020-05-27 — End: 2020-08-29

## 2020-05-27 MED ORDER — MICROLET LANCETS MISC: 11 refills | Status: DC

## 2020-05-27 MED ORDER — SHINGRIX 50 MCG/0.5ML IM SUSR: 0.5000 mL | Freq: Once | INTRAMUSCULAR | 0 refills | Status: AC

## 2020-05-27 NOTE — Progress Notes (Signed)
Subjective:     Krista Love is a 65 y.o. female and is here for a comprehensive physical exam. The patient reports no problems.recently retired.     Social History   Socioeconomic History  . Marital status: Divorced    Spouse name: Not on file  . Number of children: 1  . Years of education: Not on file  . Highest education level: Not on file  Occupational History  . 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 Smoking status: Current Every Day Smoker    Packs/day: 0.50    Types: Cigarettes  . Smokeless tobacco: Never Used  Substance and Sexual Activity  . Alcohol use: Yes  . Drug use: No  . Sexual activity: Not Currently  Other Topics Concern  . Not on file  Social History Narrative   No regular exercise. 2 cups of coffee a days.    Social Determinants of Health   Financial Resource Strain: Not on file  Food Insecurity: Not on file  Transportation Needs: Not on file  Physical Activity: Not on file  Stress: Not on file  Social Connections: Not on file  Intimate Partner Violence: Not on file   Health Maintenance  Topic Date Due  . PNA vac Low Risk Adult (1 of 2 - PCV13) 04/24/2018  . COVID-19 Vaccine (3 - Moderna risk 4-dose series) 04/28/2019  . HEMOGLOBIN A1C  04/25/2020  . OPHTHALMOLOGY EXAM  05/09/2020  . INFLUENZA VACCINE  08/18/2020  . FOOT EXAM  11/12/2020  . MAMMOGRAM  03/04/2021  . TETANUS/TDAP  02/09/2027  . COLONOSCOPY (Pts 45-52yrs Insurance coverage will need to be confirmed)  11/28/2029  . DEXA SCAN  Completed  . Hepatitis C Screening  Completed  . HPV VACCINES  Aged Out    The following portions of the patient's history were reviewed and updated as appropriate: allergies, current medications, past family history, past medical history, past social history, past surgical history and problem list.  Review of Systems A comprehensive review of systems was negative.   Objective:    BP 130/73   Pulse 92    Ht 5\' 3"  (1.6 m)   Wt 129 lb (58.5 kg)   SpO2 100%   BMI 22.85 kg/m  General appearance: alert, cooperative and appears stated age Head: Normocephalic, without obvious abnormality, atraumatic Eyes: conj clear, EOMi, PEERLA Ears: normal TM's and external ear canals both ears Nose: Nares normal. Septum midline. Mucosa normal. No drainage or sinus tenderness. Throat: lips, mucosa, and tongue normal; teeth and gums normal Neck: no adenopathy, no carotid bruit, no JVD, supple, symmetrical, trachea midline and thyroid not enlarged, symmetric, no tenderness/mass/nodules Back: symmetric, no curvature. ROM normal. No CVA tenderness. Lungs: clear to auscultation bilaterally Breasts: normal appearance, no masses or tenderness Heart: regular rate and rhythm, S1, S2 normal, no murmur, click, rub or gallop Abdomen: soft, non-tender; bowel sounds normal; no masses,  no organomegaly Extremities: extremities normal, atraumatic, no cyanosis or edema Pulses: 2+ and symmetric Skin: Skin color, texture, turgor normal. No rashes or lesions Lymph nodes: Cervical, supraclavicular, and axillary nodes normal. Neurologic: Grossly normal    Assessment:    Healthy female exam.      Plan:     See After Visit Summary for Counseling Recommendations   Keep up a regular exercise program and make sure you are eating a healthy diet Try to eat 4 servings of dairy a day, or if you are lactose  intolerant take a calcium with vitamin D daily.  Your vaccines are up to date. Given Prevnar 20 today.   Given rx for the shingrix vaccine.

## 2020-05-30 ENCOUNTER — Encounter: Payer: Self-pay | Admitting: Family Medicine

## 2020-06-02 MED ORDER — ACCU-CHEK SOFTCLIX LANCETS MISC: 99 refills | Status: DC

## 2020-06-13 ENCOUNTER — Telehealth: Payer: Self-pay | Admitting: Family Medicine

## 2020-06-13 NOTE — Chronic Care Management (AMB) (Signed)
  Chronic Care Management   Note  06/13/2020 Name: Krista Love MRN: [id number] DOB: 08/12/1955  Krista Love is a 64 y.o. year old female who is a primary care patient of Metheney, Barbarann Ehlers, MD. I reached out to Krista Love by phone today in response to a referral sent by [name]'s PCP, Agapito Games, MD.   [name] was given information about Chronic Care Management services today including:  1. CCM service includes personalized support from designated clinical staff supervised by her physician, including individualized plan of care and coordination with other care providers 2. 24/7 contact phone numbers for assistance for urgent and routine care needs. 3. Service will only be billed when office clinical staff spend 20 minutes or more in a month to coordinate care. 4. Only one practitioner may furnish and bill the service in a calendar month. 5. The patient may stop CCM services at any time (effective at the end of the month) by phone call to the office staff.   Patient agreed to services and verbal consent obtained.   Follow up plan:   Carmell [place] [employer] Scheduler

## 2020-06-13 NOTE — Progress Notes (Signed)
  Chronic Care Management   Outreach Note  06/13/2020 Name: Krista Love MRN: [id number] DOB: 1955-06-01  Referred by: Agapito Games, MD Reason for referral : No chief complaint on file.   An unsuccessful telephone outreach was attempted today. The patient was referred to the pharmacist for assistance with care management and care coordination.   Follow Up Plan:   Carmell [place] [employer] Scheduler

## 2020-06-20 ENCOUNTER — Encounter: Payer: Self-pay | Admitting: Family Medicine

## 2020-06-20 ENCOUNTER — Ambulatory Visit (INDEPENDENT_AMBULATORY_CARE_PROVIDER_SITE_OTHER): Payer: Medicare Other | Admitting: Family Medicine

## 2020-06-20 ENCOUNTER — Other Ambulatory Visit: Payer: Self-pay

## 2020-06-20 VITALS — BP 131/67 | HR 70 | Ht 63.0 in | Wt 129.0 lb

## 2020-06-20 DIAGNOSIS — L237 Allergic contact dermatitis due to plants, except food: Secondary | ICD-10-CM | POA: Diagnosis not present

## 2020-06-20 MED ORDER — PREDNISONE 20 MG PO TABS: ORAL_TABLET | ORAL | 0 refills | Status: AC

## 2020-06-20 NOTE — Telephone Encounter (Signed)
Patient scheduled.

## 2020-06-20 NOTE — Progress Notes (Signed)
Acute Office Visit  Subjective:    Patient ID: Krista Love, female    DOB: January 08, 1956, 65 y.o.   MRN: [id number]  Chief Complaint  Patient presents with  . Rash    HPI Patient is in today for Rash - she was in the yard and contacted poison ivy.  Started breaking out on Saturday and keeps getting new lesion. Now on her arms and chest and forehead. Nothing on legs.  Several have been weeping.    Past Medical History:  Diagnosis Date  . Diabetes mellitus   . Hyperlipidemia   . Hypertension     Past Surgical History:  Procedure Laterality Date  . back surgery for a ruptured disc      Family History  Problem Relation Age of Onset  . Hypertension Mother   . Diabetes Brother   . Cancer Maternal Grandfather        skin  . Stomach cancer Sister   . Heart disease Sister 23       stents.     Social History   Socioeconomic History  . Marital status: Divorced    Spouse name: Not on file  . Number of children: 1  . Years of education: Not on file  . Highest education level: Not on file  Occupational History  . 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 Smoking status: Current Every Day Smoker    Packs/day: 0.50    Types: Cigarettes  . Smokeless tobacco: Never Used  Substance and Sexual Activity  . Alcohol use: Yes  . Drug use: No  . Sexual activity: Not Currently  Other Topics Concern  . Not on file  Social History Narrative   No regular exercise. 2 cups of coffee a days.    Social Determinants of Health   Financial Resource Strain: Not on file  Food Insecurity: Not on file  Transportation Needs: Not on file  Physical Activity: Not on file  Stress: Not on file  Social Connections: Not on file  Intimate Partner Violence: Not on file    Outpatient Medications Prior to Visit  Medication Sig Dispense Refill  . Accu-Chek Softclix Lancets lancets Dx DM E11.9. Check fasting blood sugar every morning and 2 hours after  largest meal. 200 each prn  . glucose blood (ACCU-CHEK GUIDE) test strip For checking blood sugar every morning and 2 hours after largest meal of the day 3 times a week.Dx:E11.9 200 each 4  . lisinopril (ZESTRIL) 20 MG tablet TAKE 1 TABLET BY MOUTH  DAILY 90 tablet 3  . metFORMIN (GLUCOPHAGE) 1000 MG tablet Take 1 tablet (1,000 mg total) by mouth 2 (two) times daily with a meal. TAKE 1 TABLET BY MOUTH  TWICE DAILY WITH A MEAL 180 tablet 0  . rosuvastatin (CRESTOR) 40 MG tablet TAKE 1 TABLET BY MOUTH  DAILY 90 tablet 3  . venlafaxine XR (EFFEXOR-XR) 37.5 MG 24 hr capsule TAKE 1 CAPSULE BY MOUTH  DAILY WITH BREAKFAST 90 capsule 3  . zolpidem (AMBIEN) 5 MG tablet TAKE 1 TABLET(5 MG) BY MOUTH AT BEDTIME AS NEEDED FOR SLEEP 90 tablet 0   No facility-administered medications prior to visit.    Allergies  Allergen Reactions  . Codeine Nausea Only    Review of Systems     Objective:    Physical Exam  BP 131/67   Pulse 70   Ht 5\' 3"  (1.6 m)   Wt 129 lb (  58.5 kg)   SpO2 99%   BMI 22.85 kg/m  Wt Readings from Last 3 Encounters:  06/20/20 129 lb (58.5 kg)  05/27/20 129 lb (58.5 kg)  11/13/19 137 lb (62.1 kg)    Health Maintenance Due  Topic Date Due  . Pneumococcal Vaccine 54-50 Years old (1 of 4 - PCV13) Never done  . Zoster Vaccines- Shingrix (1 of 2) Never done  . PNA vac Low Risk Adult (1 of 2 - PCV13) 04/24/2018  . COVID-19 Vaccine (3 - Moderna risk 4-dose series) 04/28/2019  . HEMOGLOBIN A1C  04/25/2020  . OPHTHALMOLOGY EXAM  05/09/2020    There are no preventive care reminders to display for this patient.   Lab Results  Component Value Date   TSH 2.16 10/26/2019   Lab Results  Component Value Date   WBC 7.4 10/26/2019   HGB 13.7 10/26/2019   HCT 41.5 10/26/2019   MCV 92.0 10/26/2019   PLT 373 10/26/2019   Lab Results  Component Value Date   NA 139 10/26/2019   K 5.3 10/26/2019   CO2 28 10/26/2019   GLUCOSE 96 10/26/2019   BUN 9 10/26/2019   CREATININE  0.76 10/26/2019   BILITOT 0.3 10/26/2019   ALKPHOS 74 02/25/2016   AST 16 10/26/2019   ALT 25 10/26/2019   PROT 6.8 10/26/2019   ALBUMIN 4.3 02/25/2016   CALCIUM 10.5 (H) 10/26/2019   Lab Results  Component Value Date   CHOL 205 (H) 04/30/2019   Lab Results  Component Value Date   HDL 71 04/30/2019   Lab Results  Component Value Date   LDLCALC 107 (H) 04/30/2019   Lab Results  Component Value Date   TRIG 154 (H) 04/30/2019   Lab Results  Component Value Date   CHOLHDL 2.9 04/30/2019   Lab Results  Component Value Date   HGBA1C 6.0 (H) 10/26/2019       Assessment & Plan:   Problem List Items Addressed This Visit   None   Visit Diagnoses    Poison ivy dermatitis    -  Primary   Relevant Medications   predniSONE (DELTASONE) 20 MG tablet     Poison ivy dermatitis-discussed diagnosis.  She is currently using calamine which provides temporary relief may be for an hour or 2.  We will go ahead and prescribe oral prednisone since she has a diffuse area affected and several of them are weeping.  Call if any concerns or not healing or any sign of secondary infection.   Meds ordered this encounter  Medications  . predniSONE (DELTASONE) 20 MG tablet    Sig: Take 2 tablets (40 mg total) by mouth daily with breakfast for 5 days, THEN 1 tablet (20 mg total) daily with breakfast for 5 days, THEN 0.5 tablets (10 mg total) daily with breakfast for 4 days.    Dispense:  17 tablet    Refill:  0     Nani Gasser, MD

## 2020-06-24 ENCOUNTER — Encounter: Payer: Self-pay | Admitting: Family Medicine

## 2020-06-25 ENCOUNTER — Ambulatory Visit (INDEPENDENT_AMBULATORY_CARE_PROVIDER_SITE_OTHER): Payer: Medicare Other

## 2020-06-25 ENCOUNTER — Other Ambulatory Visit: Payer: Self-pay

## 2020-06-25 DIAGNOSIS — R7309 Other abnormal glucose: Secondary | ICD-10-CM | POA: Diagnosis not present

## 2020-06-25 DIAGNOSIS — Z1231 Encounter for screening mammogram for malignant neoplasm of breast: Secondary | ICD-10-CM

## 2020-06-25 DIAGNOSIS — E785 Hyperlipidemia, unspecified: Secondary | ICD-10-CM | POA: Diagnosis not present

## 2020-06-25 DIAGNOSIS — I10 Essential (primary) hypertension: Secondary | ICD-10-CM | POA: Diagnosis not present

## 2020-06-26 LAB — CBC
HCT: 38.9 % (ref 35.0–45.0)
Hemoglobin: 13.1 g/dL (ref 11.7–15.5)
MCH: 31.3 pg (ref 27.0–33.0)
MCHC: 33.7 g/dL (ref 32.0–36.0)
MCV: 93.1 fL (ref 80.0–100.0)
MPV: 8.5 fL (ref 7.5–12.5)
Platelets: 302 10*3/uL (ref 140–400)
RBC: 4.18 10*6/uL (ref 3.80–5.10)
RDW: 12.9 % (ref 11.0–15.0)
WBC: 8.5 10*3/uL (ref 3.8–10.8)

## 2020-06-26 LAB — COMPLETE METABOLIC PANEL WITH GFR
AG Ratio: 2 (calc) (ref 1.0–2.5)
ALT: 18 U/L (ref 6–29)
AST: 14 U/L (ref 10–35)
Albumin: 4.3 g/dL (ref 3.6–5.1)
Alkaline phosphatase (APISO): 75 U/L (ref 37–153)
BUN: 16 mg/dL (ref 7–25)
CO2: 31 mmol/L (ref 20–32)
Calcium: 9.6 mg/dL (ref 8.6–10.4)
Chloride: 101 mmol/L (ref 98–110)
Creat: 0.82 mg/dL (ref 0.50–0.99)
GFR, Est African American: 86 mL/min/{1.73_m2} (ref >=60)
GFR, Est Non African American: 74 mL/min/{1.73_m2} (ref >=60)
Globulin: 2.1 g/dL (calc) (ref 1.9–3.7)
Glucose, Bld: 82 mg/dL (ref 65–99)
Potassium: 4 mmol/L (ref 3.5–5.3)
Sodium: 139 mmol/L (ref 135–146)
Total Bilirubin: 0.2 mg/dL (ref 0.2–1.2)
Total Protein: 6.4 g/dL (ref 6.1–8.1)

## 2020-06-26 LAB — LIPID PANEL
Cholesterol: 192 mg/dL (ref <200)
HDL: 87 mg/dL (ref >=50)
LDL Cholesterol (Calc): 79 mg/dL (calc)
Non-HDL Cholesterol (Calc): 105 mg/dL (calc) (ref <130)
Total CHOL/HDL Ratio: 2.2 (calc) (ref <5.0)
Triglycerides: 161 mg/dL — ABNORMAL HIGH (ref <150)

## 2020-06-26 LAB — HEMOGLOBIN A1C
Hgb A1c MFr Bld: 6.1 % of total Hgb — ABNORMAL HIGH (ref <5.7)
Mean Plasma Glucose: 128 mg/dL
eAG (mmol/L): 7.1 mmol/L

## 2020-07-17 ENCOUNTER — Telehealth: Payer: Self-pay | Admitting: Family Medicine

## 2020-07-17 NOTE — Telephone Encounter (Signed)
Patient called to cancel appointment and did not want to reschedule.

## 2020-07-23 ENCOUNTER — Ambulatory Visit: Payer: Medicare Other

## 2020-08-03 ENCOUNTER — Other Ambulatory Visit: Payer: Self-pay | Admitting: Family Medicine

## 2020-08-03 DIAGNOSIS — E119 Type 2 diabetes mellitus without complications: Secondary | ICD-10-CM

## 2020-08-11 ENCOUNTER — Other Ambulatory Visit: Payer: Self-pay | Admitting: *Deleted

## 2020-08-11 DIAGNOSIS — F17210 Nicotine dependence, cigarettes, uncomplicated: Secondary | ICD-10-CM

## 2020-08-11 DIAGNOSIS — Z87891 Personal history of nicotine dependence: Secondary | ICD-10-CM

## 2020-08-19 ENCOUNTER — Ambulatory Visit (INDEPENDENT_AMBULATORY_CARE_PROVIDER_SITE_OTHER): Payer: Medicare Other

## 2020-08-19 ENCOUNTER — Other Ambulatory Visit: Payer: Self-pay

## 2020-08-19 DIAGNOSIS — F17210 Nicotine dependence, cigarettes, uncomplicated: Secondary | ICD-10-CM

## 2020-08-19 DIAGNOSIS — Z87891 Personal history of nicotine dependence: Secondary | ICD-10-CM

## 2020-08-21 ENCOUNTER — Encounter: Payer: Self-pay | Admitting: *Deleted

## 2020-08-21 DIAGNOSIS — Z87891 Personal history of nicotine dependence: Secondary | ICD-10-CM

## 2020-08-21 DIAGNOSIS — F17210 Nicotine dependence, cigarettes, uncomplicated: Secondary | ICD-10-CM

## 2020-08-21 NOTE — Progress Notes (Signed)
Please call patient and let them  know their  low dose Ct was read as a Lung RADS 2: nodules that are benign in appearance and behavior with a very low likelihood of becoming a clinically active cancer due to size or lack of growth. Recommendation per radiology is for a repeat LDCT in 12 months. .Please let them  know we will order and schedule their  annual screening scan for 08/2021. Please let them  know there was notation of CAD on their  scan.  Please remind the patient  that this is a non-gated exam therefore degree or severity of disease  cannot be determined. Please have them  follow up with their PCP regarding potential risk factor modification, dietary therapy or pharmacologic therapy if clinically indicated. Pt.  is  currently on statin therapy. Please place order for annual  screening scan for  08/2021 and fax results to PCP. Thanks so much.  Denise, scan is + for CAD and aotric stenosis. Please have patient follow up with PCP regarding plan of care.

## 2020-08-27 ENCOUNTER — Other Ambulatory Visit: Payer: Self-pay | Admitting: *Deleted

## 2020-08-27 DIAGNOSIS — G4700 Insomnia, unspecified: Secondary | ICD-10-CM

## 2020-08-29 ENCOUNTER — Encounter: Payer: Self-pay | Admitting: Family Medicine

## 2020-08-29 DIAGNOSIS — G4700 Insomnia, unspecified: Secondary | ICD-10-CM

## 2020-08-29 MED ORDER — ZOLPIDEM TARTRATE 5 MG PO TABS: ORAL_TABLET | ORAL | 0 refills | Status: DC

## 2020-08-29 NOTE — Telephone Encounter (Signed)
Last RX was on 05/27/20 for #90.  Tiajuana Amass, CMA

## 2020-10-06 ENCOUNTER — Other Ambulatory Visit: Payer: Self-pay | Admitting: Family Medicine

## 2020-10-06 DIAGNOSIS — E119 Type 2 diabetes mellitus without complications: Secondary | ICD-10-CM

## 2020-11-27 ENCOUNTER — Encounter: Payer: Self-pay | Admitting: Family Medicine

## 2020-11-27 ENCOUNTER — Other Ambulatory Visit: Payer: Self-pay

## 2020-11-27 DIAGNOSIS — G4700 Insomnia, unspecified: Secondary | ICD-10-CM

## 2020-11-28 MED ORDER — ZOLPIDEM TARTRATE 5 MG PO TABS: ORAL_TABLET | ORAL | 0 refills | Status: DC

## 2020-11-28 NOTE — Telephone Encounter (Signed)
Meds ordered this encounter  Medications   zolpidem (AMBIEN) 5 MG tablet    Sig: TAKE 1 TABLET(5 MG) BY MOUTH AT BEDTIME AS NEEDED FOR SLEEP    Dispense:  90 tablet    Refill:  0

## 2021-01-15 ENCOUNTER — Other Ambulatory Visit: Payer: Self-pay | Admitting: Family Medicine

## 2021-01-15 DIAGNOSIS — I10 Essential (primary) hypertension: Secondary | ICD-10-CM

## 2021-01-15 DIAGNOSIS — E785 Hyperlipidemia, unspecified: Secondary | ICD-10-CM

## 2021-01-30 ENCOUNTER — Encounter: Payer: Self-pay | Admitting: Family Medicine

## 2021-02-01 ENCOUNTER — Encounter: Payer: Self-pay | Admitting: Family Medicine

## 2021-02-02 ENCOUNTER — Ambulatory Visit: Payer: Medicare Other | Admitting: Family Medicine

## 2021-02-18 ENCOUNTER — Encounter: Payer: Self-pay | Admitting: Family Medicine

## 2021-02-18 ENCOUNTER — Ambulatory Visit (INDEPENDENT_AMBULATORY_CARE_PROVIDER_SITE_OTHER): Payer: Medicare Other | Admitting: Family Medicine

## 2021-02-18 ENCOUNTER — Other Ambulatory Visit: Payer: Self-pay

## 2021-02-18 VITALS — BP 129/75 | HR 93 | Resp 16 | Ht 63.0 in | Wt 140.0 lb

## 2021-02-18 DIAGNOSIS — J40 Bronchitis, not specified as acute or chronic: Secondary | ICD-10-CM | POA: Diagnosis not present

## 2021-02-18 DIAGNOSIS — R300 Dysuria: Secondary | ICD-10-CM | POA: Diagnosis not present

## 2021-02-18 DIAGNOSIS — J439 Emphysema, unspecified: Secondary | ICD-10-CM | POA: Diagnosis not present

## 2021-02-18 DIAGNOSIS — L820 Inflamed seborrheic keratosis: Secondary | ICD-10-CM | POA: Diagnosis not present

## 2021-02-18 DIAGNOSIS — I10 Essential (primary) hypertension: Secondary | ICD-10-CM

## 2021-02-18 DIAGNOSIS — J329 Chronic sinusitis, unspecified: Secondary | ICD-10-CM

## 2021-02-18 DIAGNOSIS — E119 Type 2 diabetes mellitus without complications: Secondary | ICD-10-CM | POA: Diagnosis not present

## 2021-02-18 LAB — POCT URINALYSIS DIP (CLINITEK)
Bilirubin, UA: NEGATIVE
Blood, UA: NEGATIVE
Glucose, UA: NEGATIVE mg/dL
Ketones, POC UA: NEGATIVE mg/dL
Leukocytes, UA: NEGATIVE
Nitrite, UA: NEGATIVE
POC PROTEIN,UA: NEGATIVE
Spec Grav, UA: 1.01 (ref 1.010–1.025)
Urobilinogen, UA: 0.2 E.U./dL
pH, UA: 7 (ref 5.0–8.0)

## 2021-02-18 LAB — POCT GLYCOSYLATED HEMOGLOBIN (HGB A1C): Hemoglobin A1C: 6.2 % — AB (ref 4.0–5.6)

## 2021-02-18 MED ORDER — AMOXICILLIN-POT CLAVULANATE 875-125 MG PO TABS: 1.0000 | ORAL_TABLET | Freq: Two times a day (BID) | ORAL | 0 refills | Status: DC

## 2021-02-18 MED ORDER — AMBULATORY NON FORMULARY MEDICATION: 1 refills | Status: DC

## 2021-02-18 NOTE — Assessment & Plan Note (Signed)
Well controlled. Continue current regimen. Follow up in  4 months. Due for labs.  Come back tomorrow for fasting labs.

## 2021-02-18 NOTE — Assessment & Plan Note (Signed)
Well controlled. Continue current regimen. Follow up in  4 months.   

## 2021-02-18 NOTE — Assessment & Plan Note (Signed)
No worrisome findings on lung exam today.  But we did discuss that if her cough does not improve after antibiotics to let me know and we will get an updated chest x-ray.  She does have prolonged expiration.

## 2021-02-18 NOTE — Progress Notes (Signed)
Acute Office Visit  Subjective:    Patient ID: Krista Love, female    DOB: 10/05/1955, 66 y.o.   MRN: [id number]  Chief Complaint  Patient presents with   Cough    Productive cough. Nasal congestion since 12/2020.    Urinary Frequency    3-4 months    Breast Growth    3-4 months. Increasing in size/itches.     HPI Patient is in today for cough and congestion x 1 month with nasal congestion.  No facial pain now but did before.  No recent fever or chills.  No ear pain or sore throat recently.  She just feels completely exhausted.  Does report some intermittent shortness of breath.  Diabetes - no hypoglycemic events. No wounds or sores that are not healing well. No increased thirst or urination. Checking glucose at home. Taking medications as prescribed without any side effects.  Has growth on her right breast x 4 months and it is getting larger.  She says its been itchy.  She did call dermatology but they cannot get her in until the end of February.  So she wanted me to just take a look at it today.  Wants to come back for labs.   Also reports some urinary frequency as well as some urgency and some intermittent leaking.  Past Medical History:  Diagnosis Date   Diabetes mellitus    Hyperlipidemia    Hypertension     Past Surgical History:  Procedure Laterality Date   back surgery for a ruptured disc      Family History  Problem Relation Age of Onset   Hypertension Mother    Diabetes Brother    Cancer Maternal Grandfather        skin   Stomach cancer Sister    Heart disease Sister [id number]       stents.     Social History   Socioeconomic History   Marital status: Divorced    Spouse name: Not on file   Number of children: 1   Years of education: Not on file   Highest education level: Not on file  Occupational History   Occupation: SALES REPORTING [employer]    Employer: [employer]  Tobacco Use   Smoking status: Every Day    Packs/day: 1.00    Types:  Cigarettes   Smokeless tobacco: Never  Substance and Sexual Activity   Alcohol use: Yes   Drug use: No   Sexual activity: Not Currently  Other Topics Concern   Not on file  Social History Narrative   No regular exercise. 2 cups of coffee a days.    Social Determinants of Health   Financial Resource Strain: Not on file  Food Insecurity: Not on file  Transportation Needs: Not on file  Physical Activity: Not on file  Stress: Not on file  Social Connections: Not on file  Intimate Partner Violence: Not on file    Outpatient Medications Prior to Visit  Medication Sig Dispense Refill   Accu-Chek Softclix Lancets lancets Dx DM E11.9. Check fasting blood sugar every morning and 2 hours after largest meal. 200 each prn   glucose blood (ACCU-CHEK GUIDE) test strip USE TO CHECK BLOOD SUGAR IN THE MORNING AND 2 HOURS  AFTER LARGEST MEAL OF THE  DAY 3 TIMES WEEKLY 100 strip 3   lisinopril (ZESTRIL) 20 MG tablet TAKE 1 TABLET BY MOUTH  DAILY 90 tablet 3   metFORMIN (GLUCOPHAGE) 1000 MG tablet TAKE 1  TABLET BY MOUTH  TWICE DAILY WITH MEALS 180 tablet 3   rosuvastatin (CRESTOR) 40 MG tablet TAKE 1 TABLET BY MOUTH  DAILY 90 tablet 3   venlafaxine XR (EFFEXOR-XR) 37.5 MG 24 hr capsule TAKE 1 CAPSULE BY MOUTH  DAILY WITH BREAKFAST 90 capsule 3   zolpidem (AMBIEN) 5 MG tablet TAKE 1 TABLET(5 MG) BY MOUTH AT BEDTIME AS NEEDED FOR SLEEP 90 tablet 0   No facility-administered medications prior to visit.    Allergies  Allergen Reactions   Codeine Nausea Only    Review of Systems     Objective:    Physical Exam Vitals and nursing note reviewed.  Constitutional:      Appearance: She is well-developed.  HENT:     Head: Normocephalic and atraumatic.     Right Ear: External ear normal.     Left Ear: External ear normal.     Nose: Nose normal.  Eyes:     Conjunctiva/sclera: Conjunctivae normal.     Pupils: Pupils are equal, round, and reactive to light.  Neck:     Thyroid: No thyromegaly.   Cardiovascular:     Rate and Rhythm: Normal rate and regular rhythm.     Heart sounds: Normal heart sounds.  Pulmonary:     Effort: Pulmonary effort is normal.     Breath sounds: Normal breath sounds. No wheezing.  Musculoskeletal:     Cervical back: Neck supple.  Lymphadenopathy:     Cervical: No cervical adenopathy.  Skin:    General: Skin is warm and dry.  Neurological:     Mental Status: She is alert and oriented to person, place, and time.  Psychiatric:        Behavior: Behavior normal.    BP 129/75    Pulse 93    Resp 16    Ht 5\' 3"  (1.6 m)    Wt 140 lb (63.5 kg)    SpO2 98%    BMI 24.80 kg/m  Wt Readings from Last 3 Encounters:  02/18/21 140 lb (63.5 kg)  06/20/20 129 lb (58.5 kg)  05/27/20 129 lb (58.5 kg)    Health Maintenance Due  Topic Date Due   Zoster Vaccines- Shingrix (1 of 2) Never done   COVID-19 Vaccine (3 - Moderna risk series) 04/28/2019   INFLUENZA VACCINE  08/18/2020   FOOT EXAM  11/12/2020    There are no preventive care reminders to display for this patient.   Lab Results  Component Value Date   TSH 2.16 10/26/2019   Lab Results  Component Value Date   WBC 8.5 06/25/2020   HGB 13.1 06/25/2020   HCT 38.9 06/25/2020   MCV 93.1 06/25/2020   PLT 302 06/25/2020   Lab Results  Component Value Date   NA 139 06/25/2020   K 4.0 06/25/2020   CO2 31 06/25/2020   GLUCOSE 82 06/25/2020   BUN 16 06/25/2020   CREATININE 0.82 06/25/2020   BILITOT 0.2 06/25/2020   ALKPHOS 74 02/25/2016   AST 14 06/25/2020   ALT 18 06/25/2020   PROT 6.4 06/25/2020   ALBUMIN 4.3 02/25/2016   CALCIUM 9.6 06/25/2020   Lab Results  Component Value Date   CHOL 192 06/25/2020   Lab Results  Component Value Date   HDL 87 06/25/2020   Lab Results  Component Value Date   LDLCALC 79 06/25/2020   Lab Results  Component Value Date   TRIG 161 (H) 06/25/2020   Lab Results  Component Value Date  CHOLHDL 2.2 06/25/2020   Lab Results  Component Value Date    HGBA1C 6.2 (A) 02/18/2021       Assessment & Plan:   Problem List Items Addressed This Visit       Cardiovascular and Mediastinum   HYPERTENSION, BENIGN    Well controlled. Continue current regimen. Follow up in  4 months.         Respiratory   Emphysema lung (HCC)    No worrisome findings on lung exam today.  But we did discuss that if her cough does not improve after antibiotics to let me know and we will get an updated chest x-ray.  She does have prolonged expiration.        Endocrine   Diabetes type 2, controlled (HCC) - Primary    Well controlled. Continue current regimen. Follow up in  4 months. Due for labs.  Come back tomorrow for fasting labs.      Relevant Orders   Lipid Panel w/reflex Direct LDL   COMPLETE METABOLIC PANEL WITH GFR   CBC   POCT HgB A1C (Completed)   Other Visit Diagnoses     Sinobronchitis       Relevant Medications   amoxicillin-clavulanate (AUGMENTIN) 875-125 MG tablet   Inflamed seborrheic keratosis       Dysuria       Relevant Orders   POCT URINALYSIS DIP (CLINITEK) (Completed)      Sinobronchits - will tx with Augmentin. Call if nto better in one week  if cough persists then please let me know so we can consider a chest xray .  Urge incontinence-urinalysis today looks good no sign of infection, bladder excess protein.  Based on her description it sounds more like urge incontinence.  We can certainly consider referral to urology for further work-up or maybe even an OAB medication at some  Cryotherapy Procedure Note  Pre-operative Diagnosis: inflamed seborrhea keratosis.    Post-operative Diagnosis: same  Locations: same   Indications: irritation, swelling and getting larger.   Anesthesia: not required   Procedure Details  Patient informed of risks (permanent scarring, infection, light or dark discoloration, bleeding, infection, weakness, numbness and recurrence of the lesion) and benefits of the procedure and verbal  informed consent obtained.  The areas are treated with liquid nitrogen therapy, frozen until ice ball extended 1 mm beyond lesion, allowed to thaw, and treated again. The patient tolerated procedure well.  The patient was instructed on post-op care, warned that there may be blister formation, redness and pain. Recommend OTC analgesia as needed for pain.  Condition: Stable  Complications: none.  Plan: 1. Instructed to keep the area dry and covered for 24-48h and clean thereafter. 2. Warning signs of infection were reviewed.   3. Recommended that the patient use OTC acetaminophen as needed for pain.  4. Return PRN.    Meds ordered this encounter  Medications   amoxicillin-clavulanate (AUGMENTIN) 875-125 MG tablet    Sig: Take 1 tablet by mouth 2 (two) times daily.    Dispense:  14 tablet    Refill:  0   AMBULATORY NON FORMULARY MEDICATION    Sig: Medication Name: Shingles vaccine IM ( Zostavax).    Dispense:  1 Units    Refill:  1     Nani Gasser, MD

## 2021-02-19 DIAGNOSIS — E119 Type 2 diabetes mellitus without complications: Secondary | ICD-10-CM | POA: Diagnosis not present

## 2021-02-19 LAB — CBC
HCT: 40.1 % (ref 35.0–45.0)
Hemoglobin: 13.1 g/dL (ref 11.7–15.5)
MCH: 30.3 pg (ref 27.0–33.0)
MCHC: 32.7 g/dL (ref 32.0–36.0)
MCV: 92.6 fL (ref 80.0–100.0)
MPV: 9 fL (ref 7.5–12.5)
Platelets: 335 10*3/uL (ref 140–400)
RBC: 4.33 10*6/uL (ref 3.80–5.10)
RDW: 12.8 % (ref 11.0–15.0)
WBC: 5.8 10*3/uL (ref 3.8–10.8)

## 2021-02-19 LAB — COMPLETE METABOLIC PANEL WITH GFR
AG Ratio: 1.9 (calc) (ref 1.0–2.5)
ALT: 17 U/L (ref 6–29)
AST: 17 U/L (ref 10–35)
Albumin: 4.4 g/dL (ref 3.6–5.1)
Alkaline phosphatase (APISO): 82 U/L (ref 37–153)
BUN: 9 mg/dL (ref 7–25)
CO2: 27 mmol/L (ref 20–32)
Calcium: 9.7 mg/dL (ref 8.6–10.4)
Chloride: 106 mmol/L (ref 98–110)
Creat: 0.7 mg/dL (ref 0.50–1.05)
Globulin: 2.3 g/dL (calc) (ref 1.9–3.7)
Glucose, Bld: 101 mg/dL — ABNORMAL HIGH (ref 65–99)
Potassium: 4.3 mmol/L (ref 3.5–5.3)
Sodium: 142 mmol/L (ref 135–146)
Total Bilirubin: 0.4 mg/dL (ref 0.2–1.2)
Total Protein: 6.7 g/dL (ref 6.1–8.1)
eGFR: 95 mL/min/{1.73_m2} (ref >=60)

## 2021-02-19 LAB — LIPID PANEL W/REFLEX DIRECT LDL
Cholesterol: 177 mg/dL (ref <200)
HDL: 68 mg/dL (ref >=50)
LDL Cholesterol (Calc): 87 mg/dL (calc)
Non-HDL Cholesterol (Calc): 109 mg/dL (calc) (ref <130)
Total CHOL/HDL Ratio: 2.6 (calc) (ref <5.0)
Triglycerides: 120 mg/dL (ref <150)

## 2021-02-19 NOTE — Progress Notes (Signed)
Your lab work is within acceptable range and there are no concerning findings.   ?

## 2021-02-26 ENCOUNTER — Encounter: Payer: Self-pay | Admitting: Family Medicine

## 2021-02-26 ENCOUNTER — Other Ambulatory Visit: Payer: Self-pay | Admitting: Family Medicine

## 2021-02-26 DIAGNOSIS — G4700 Insomnia, unspecified: Secondary | ICD-10-CM

## 2021-03-01 NOTE — Telephone Encounter (Signed)
Med filled 2/12

## 2021-03-02 ENCOUNTER — Encounter: Payer: Self-pay | Admitting: Family Medicine

## 2021-05-20 LAB — HM DIABETES EYE EXAM

## 2021-05-21 ENCOUNTER — Other Ambulatory Visit: Payer: Self-pay | Admitting: *Deleted

## 2021-05-21 MED ORDER — ACCU-CHEK SOFTCLIX LANCETS MISC: 99 refills | Status: AC

## 2021-05-28 ENCOUNTER — Encounter: Payer: Self-pay | Admitting: Family Medicine

## 2021-06-18 ENCOUNTER — Ambulatory Visit (INDEPENDENT_AMBULATORY_CARE_PROVIDER_SITE_OTHER): Payer: Medicare Other | Admitting: Family Medicine

## 2021-06-18 ENCOUNTER — Encounter: Payer: Self-pay | Admitting: Family Medicine

## 2021-06-18 ENCOUNTER — Other Ambulatory Visit: Payer: Self-pay | Admitting: Family Medicine

## 2021-06-18 VITALS — BP 110/60 | HR 70 | Ht 63.0 in | Wt 140.0 lb

## 2021-06-18 DIAGNOSIS — I10 Essential (primary) hypertension: Secondary | ICD-10-CM | POA: Diagnosis not present

## 2021-06-18 DIAGNOSIS — G4700 Insomnia, unspecified: Secondary | ICD-10-CM | POA: Diagnosis not present

## 2021-06-18 DIAGNOSIS — R809 Proteinuria, unspecified: Secondary | ICD-10-CM | POA: Diagnosis not present

## 2021-06-18 DIAGNOSIS — E1129 Type 2 diabetes mellitus with other diabetic kidney complication: Secondary | ICD-10-CM

## 2021-06-18 DIAGNOSIS — Z1231 Encounter for screening mammogram for malignant neoplasm of breast: Secondary | ICD-10-CM

## 2021-06-18 DIAGNOSIS — E119 Type 2 diabetes mellitus without complications: Secondary | ICD-10-CM

## 2021-06-18 LAB — POCT UA - MICROALBUMIN
Albumin/Creatinine Ratio, Urine, POC: <30
Creatinine, POC: 50 mg/dL
Microalbumin Ur, POC: 10 mg/L

## 2021-06-18 LAB — POCT GLYCOSYLATED HEMOGLOBIN (HGB A1C): Hemoglobin A1C: 6 % — AB (ref 4.0–5.6)

## 2021-06-18 NOTE — Addendum Note (Signed)
Addended by: Deno Etienne on: 06/18/2021 12:31 PM   Modules accepted: Orders

## 2021-06-18 NOTE — Patient Instructions (Addendum)
Please schedule Medicare wellness with [place] at her convenience. We labs when I see you back.

## 2021-06-18 NOTE — Assessment & Plan Note (Signed)
A1C looks even better today at 6.0.  She is really doing fantastic.  Continue current regimen.  Foot exam performed today.  Due for urine microalbumin.  Follow-up in 4 months.

## 2021-06-18 NOTE — Assessment & Plan Note (Signed)
Uses Ambien 5 mg most nights.  She does occasionally just feels like it does not work.  We did discuss alternatives such as Lunesta.  She says for now she will just stick with the Ambien.

## 2021-06-18 NOTE — Progress Notes (Signed)
   Established Patient Office Visit  Subjective   Patient ID: Krista Love, female    DOB: 09-Dec-1955  Age: 66 y.o. MRN: [id number]  Chief Complaint  Patient presents with   Diabetes   Hypertension    HPI  Diabetes - no hypoglycemic events. No wounds or sores that are not healing well. No increased thirst or urination. Checking glucose at home. Taking medications as prescribed without any side effects.  Hypertension- Pt denies chest pain, SOB, dizziness, or heart palpitations.  Taking meds as directed w/o problems.  Denies medication side effects.    She did want to know when her next lung cancer screening appointment should be.  She says she also got a call from our office about some type of wellness exam and says she called back but never got a return call.     ROS    Objective:     BP 110/60   Pulse 70   Ht 5\' 3"  (1.6 m)   Wt 140 lb (63.5 kg)   SpO2 99%   BMI 24.80 kg/m    Physical Exam Vitals and nursing note reviewed.  Constitutional:      Appearance: She is well-developed.  HENT:     Head: Normocephalic and atraumatic.  Cardiovascular:     Rate and Rhythm: Normal rate and regular rhythm.     Heart sounds: Normal heart sounds.  Pulmonary:     Effort: Pulmonary effort is normal.     Breath sounds: Normal breath sounds.  Skin:    General: Skin is warm and dry.  Neurological:     Mental Status: She is alert and oriented to person, place, and time.  Psychiatric:        Behavior: Behavior normal.     Results for orders placed or performed in visit on 06/18/21  POCT glycosylated hemoglobin (Hb A1C)  Result Value Ref Range   Hemoglobin A1C 6.0 (A) 4.0 - 5.6 %   HbA1c POC (<> result, manual entry)     HbA1c, POC (prediabetic range)     HbA1c, POC (controlled diabetic range)        The 10-year ASCVD risk score (Arnett DK, et al., 2019) is: 21.2%    Assessment & Plan:   Problem List Items Addressed This Visit       Cardiovascular and Mediastinum    HYPERTENSION, BENIGN - Primary    Well controlled. Continue current regimen. Follow up in  4 mo          Endocrine   Microalbuminuria due to type 2 diabetes mellitus (HCC)    For repeat urine microalbumin today.  She is on an ACE inhibitor.       Diabetes type 2, controlled (HCC)    A1C looks even better today at 6.0.  She is really doing fantastic.  Continue current regimen.  Foot exam performed today.  Due for urine microalbumin.  Follow-up in 4 months.       Relevant Orders   POCT glycosylated hemoglobin (Hb A1C) (Completed)    Urged her to schedule her Medicare wellness exam on her way out today.  Also encouraged her to schedule her mammogram at her convenience this summer.  She will be due for her lung cancer screening in August.  Return in about 4 months (around 10/18/2021) for Diabetes follow-up.    12/18/2021, MD

## 2021-06-18 NOTE — Assessment & Plan Note (Addendum)
For repeat urine microalbumin today.  She is on an ACE inhibitor.

## 2021-06-18 NOTE — Assessment & Plan Note (Signed)
Well controlled. Continue current regimen. Follow up in  4 mo 

## 2021-07-05 ENCOUNTER — Other Ambulatory Visit: Payer: Self-pay | Admitting: Family Medicine

## 2021-07-05 DIAGNOSIS — E119 Type 2 diabetes mellitus without complications: Secondary | ICD-10-CM

## 2021-07-07 DIAGNOSIS — S60453A Superficial foreign body of left middle finger, initial encounter: Secondary | ICD-10-CM | POA: Diagnosis not present

## 2021-08-19 ENCOUNTER — Ambulatory Visit (INDEPENDENT_AMBULATORY_CARE_PROVIDER_SITE_OTHER): Payer: Medicare Other

## 2021-08-19 DIAGNOSIS — F17210 Nicotine dependence, cigarettes, uncomplicated: Secondary | ICD-10-CM | POA: Diagnosis not present

## 2021-08-19 DIAGNOSIS — Z87891 Personal history of nicotine dependence: Secondary | ICD-10-CM

## 2021-08-19 DIAGNOSIS — Z122 Encounter for screening for malignant neoplasm of respiratory organs: Secondary | ICD-10-CM

## 2021-08-20 ENCOUNTER — Other Ambulatory Visit: Payer: Self-pay | Admitting: Family Medicine

## 2021-08-20 DIAGNOSIS — E119 Type 2 diabetes mellitus without complications: Secondary | ICD-10-CM

## 2021-08-27 ENCOUNTER — Encounter: Payer: Self-pay | Admitting: Family Medicine

## 2021-08-27 DIAGNOSIS — G4700 Insomnia, unspecified: Secondary | ICD-10-CM

## 2021-08-28 MED ORDER — ZOLPIDEM TARTRATE 5 MG PO TABS: 5.0000 mg | ORAL_TABLET | Freq: Every evening | ORAL | 0 refills | Status: DC | PRN

## 2021-09-03 ENCOUNTER — Other Ambulatory Visit: Payer: Self-pay

## 2021-09-03 DIAGNOSIS — Z87891 Personal history of nicotine dependence: Secondary | ICD-10-CM

## 2021-09-03 DIAGNOSIS — Z122 Encounter for screening for malignant neoplasm of respiratory organs: Secondary | ICD-10-CM

## 2021-09-03 DIAGNOSIS — F17210 Nicotine dependence, cigarettes, uncomplicated: Secondary | ICD-10-CM

## 2021-09-09 ENCOUNTER — Encounter: Payer: Self-pay | Admitting: General Practice

## 2021-09-30 ENCOUNTER — Ambulatory Visit: Payer: Medicare Other

## 2021-10-15 ENCOUNTER — Ambulatory Visit: Payer: Medicare Other | Admitting: Family Medicine

## 2021-10-16 ENCOUNTER — Ambulatory Visit (INDEPENDENT_AMBULATORY_CARE_PROVIDER_SITE_OTHER): Payer: Medicare Other | Admitting: Physician Assistant

## 2021-10-16 DIAGNOSIS — Z0000 Encounter for general adult medical examination without abnormal findings: Secondary | ICD-10-CM | POA: Diagnosis not present

## 2021-10-16 DIAGNOSIS — Z780 Asymptomatic menopausal state: Secondary | ICD-10-CM | POA: Diagnosis not present

## 2021-10-16 NOTE — Progress Notes (Signed)
MEDICARE ANNUAL WELLNESS VISIT  10/16/2021  Telephone Visit Disclaimer This Medicare AWV was conducted by telephone due to national recommendations for restrictions regarding the COVID-19 Pandemic (e.g. social distancing).  I verified, using two identifiers, that I am speaking with Krista Love or their authorized healthcare agent. I discussed the limitations, risks, security, and privacy concerns of performing an evaluation and management service by telephone and the potential availability of an in-person appointment in the future. The patient expressed understanding and agreed to proceed.  Location of Patient: Home Location of Provider (nurse):  Provider home  Subjective:    Krista Love is a 66 y.o. female patient of Metheney, Rene Kocher, MD who had a Medicare Annual Wellness Visit today via telephone. Krista Love is Retired and lives with an adult companion. she has 1 child. she reports that she is socially active and does interact with friends/family regularly. she is minimally physically active and enjoys watching sports, cook, reading and yardwork.  Patient Care Team: Hali Marry, MD as PCP - Mariel Craft, OD as Referring Physician (Optometry) Darius Bump, [facility] as Pharmacist (Pharmacist)     10/16/2021    1:04 PM 05/27/2020    9:08 AM  Advanced Directives  Does Patient Have a Medical Advance Directive? Yes Yes  Type of Advance Directive Living will Living will;Healthcare Power of Attorney  Does patient want to make changes to medical advance directive? No - Patient declined   Copy of [place] in Chart?  No - copy requested    Hospital Utilization Over the Past 12 Months: # of hospitalizations or ER visits: 0 # of surgeries: 0  Review of Systems    Patient reports that her overall health is unchanged compared to last year.  History obtained from chart review and the patient  Patient Reported Readings (BP, Pulse, CBG, Weight,  etc) none  Pain Assessment Pain : No/denies pain     Current Medications & Allergies (verified) Allergies as of 10/16/2021       Reactions   Codeine Nausea Only        Medication List        Accurate as of October 16, 2021  1:22 PM. If you have any questions, ask your nurse or doctor.          Accu-Chek Guide test strip Generic drug: glucose blood USE TO CHECK BLOOD SUGAR IN THE MORNING AND 2 HOURS  AFTER LARGEST MEAL OF THE  DAY 3 TIMES WEEKLY   Accu-Chek Softclix Lancets lancets Dx DM E11.9. Check fasting blood sugar every morning and 2 hours after largest meal.   lisinopril 20 MG tablet Commonly known as: ZESTRIL TAKE 1 TABLET BY MOUTH  DAILY   metFORMIN 1000 MG tablet Commonly known as: GLUCOPHAGE TAKE 1 TABLET BY MOUTH  TWICE DAILY WITH MEALS   rosuvastatin 40 MG tablet Commonly known as: CRESTOR TAKE 1 TABLET BY MOUTH  DAILY   venlafaxine XR 37.5 MG 24 hr capsule Commonly known as: EFFEXOR-XR TAKE 1 CAPSULE BY MOUTH  DAILY WITH BREAKFAST   zolpidem 5 MG tablet Commonly known as: AMBIEN Take 1 tablet (5 mg total) by mouth at bedtime as needed for sleep.        History (reviewed): Past Medical History:  Diagnosis Date   Diabetes mellitus    Hyperlipidemia    Hypertension    Past Surgical History:  Procedure Laterality Date   back surgery for a ruptured disc  Family History  Problem Relation Age of Onset   Hypertension Mother    Diabetes Brother    Cancer Maternal Grandfather        skin   Stomach cancer Sister    Heart disease Sister 68       stents.    Social History   Socioeconomic History   Marital status: Divorced    Spouse name: Not on file   Number of children: 1   Years of education: 13   Highest education level: Some college, no degree  Occupational History   Occupation: Press photographer REPORTING [employer]    Employer: [employer]   Occupation: Retired.  Tobacco Use   Smoking status: Every Day    Packs/day: 1.00     Types: Cigarettes   Smokeless tobacco: Never  Substance and Sexual Activity   Alcohol use: Yes   Drug use: No   Sexual activity: Not Currently  Other Topics Concern   Not on file  Social History Narrative   Lives with a friend. Her son lives in [place]. She enjoys watching sports, cooking, reading and yard work.   Social Determinants of Health   Financial Resource Strain: Low Risk  (10/16/2021)   Overall Financial Resource Strain (CARDIA)    Difficulty of Paying Living Expenses: Not hard at all  Food Insecurity: No Food Insecurity (10/16/2021)   Hunger Vital Sign    Worried About Running Out of Food in the Last Year: Never true    Ran Out of Food in the Last Year: Never true  Transportation Needs: No Transportation Needs (10/16/2021)   PRAPARE - Hydrologist (Medical): No    Lack of Transportation (Non-Medical): No  Physical Activity: Inactive (10/16/2021)   Exercise Vital Sign    Days of Exercise per Week: 0 days    Minutes of Exercise per Session: 0 min  Stress: No Stress Concern Present (10/16/2021)   [place]    Feeling of Stress : Not at all  Social Connections: Socially Isolated (10/16/2021)   Social Connection and Isolation Panel [NHANES]    Frequency of Communication with Friends and Family: Three times a week    Frequency of Social Gatherings with Friends and Family: More than three times a week    Attends Religious Services: Never    Marine scientist or Organizations: No    Attends Archivist Meetings: Never    Marital Status: Divorced    Activities of Daily Living    10/16/2021    1:10 PM  In your present state of health, do you have any difficulty performing the following activities:  Hearing? 0  Krista? 0  Difficulty concentrating or making decisions? 0  Walking or climbing stairs? 0  Dressing or bathing? 0  Doing errands, shopping? 0   Preparing Food and eating ? N  Using the Toilet? N  In the past six months, have you accidently leaked urine? Y  Do you have problems with loss of bowel control? N  Managing your Medications? N  Managing your Finances? N  Housekeeping or managing your Housekeeping? N    Patient Education/ Literacy How often do you need to have someone help you when you read instructions, pamphlets, or other written materials from your doctor or pharmacy?: 1 - Never What is the last grade level you completed in school?: some college  Exercise Current Exercise Habits: The patient does not participate in  regular exercise at present, Exercise limited by: None identified  Diet Patient reports consuming 2 meals a day and 2 snack(s) a day Patient reports that her primary diet is: Regular Patient reports that she does have regular access to food.   Depression Screen    10/16/2021    1:05 PM 06/18/2021    8:18 AM 05/27/2020    9:09 AM 05/27/2020    9:07 AM 12/13/2018    8:37 AM 11/28/2018   12:48 PM 06/05/2018    7:21 AM  PHQ 2/9 Scores  PHQ - 2 Score 0 0 0 0 0 0 0  PHQ- 9 Score   0  0       Fall Risk    10/16/2021    1:05 PM 06/18/2021    8:18 AM 05/27/2020    9:07 AM 11/28/2018   12:48 PM 10/06/2017    8:06 AM  [place] in the past year? 0 0 0 0 No  Number falls in past yr: 0 0 0 0   Injury with Fall? 0 0 0 0   Risk for fall due to : No Fall Risks No Fall Risks No Fall Risks    Follow up Falls evaluation completed Follow up appointment Falls prevention discussed;Falls evaluation completed       Objective:  Krista Love [facility] seemed alert and oriented and she participated appropriately during our telephone visit.  Blood Pressure Weight BMI  BP Readings from Last 3 Encounters:  06/18/21 110/60  02/18/21 129/75  06/20/20 131/67   Wt Readings from Last 3 Encounters:  06/18/21 140 lb (63.5 kg)  02/18/21 140 lb (63.5 kg)  06/20/20 129 lb (58.5 kg)   BMI Readings from Last 1 Encounters:   06/18/21 24.80 kg/m    *Unable to obtain current vital signs, weight, and BMI due to telephone visit type  Hearing/Krista  Krista Love did not seem to have difficulty with hearing/understanding during the telephone conversation Reports that she has had a formal eye exam by an eye care professional within the past year Reports that she has not had a formal hearing evaluation within the past year *Unable to fully assess hearing and Krista during telephone visit type  Cognitive Function:    10/16/2021    1:13 PM  6CIT Screen  What Year? 0 points  What month? 0 points  What time? 0 points  Count back from 20 0 points  Months in reverse 0 points  Repeat phrase 0 points  Total Score 0 points   (Normal:0-7, Significant for Dysfunction: >8)  Normal Cognitive Function Screening: Yes   Immunization & Health Maintenance Record Immunization History  Administered Date(s) Administered   Moderna SARS-COV2 Booster Vaccination 12/07/2019   Moderna Sars-Covid-2 Vaccination 03/03/2019, 03/31/2019   PNEUMOCOCCAL CONJUGATE-20 05/27/2020   Pneumococcal Polysaccharide-23 11/19/2014   Td 01/18/2006   Tdap 02/08/2017   Zoster Recombinat (Shingrix) 03/02/2021   Zoster, Live 09/12/2013    Health Maintenance  Topic Date Due   COVID-19 Vaccine (3 - Moderna risk series) 11/01/2021 (Originally 01/04/2020)   Zoster Vaccines- Shingrix (2 of 2) 01/15/2022 (Originally 04/27/2021)   INFLUENZA VACCINE  04/18/2022 (Originally 08/18/2021)   HEMOGLOBIN A1C  12/18/2021   Diabetic kidney evaluation - GFR measurement  02/19/2022   OPHTHALMOLOGY EXAM  05/21/2022   Diabetic kidney evaluation - Urine ACR  06/19/2022   FOOT EXAM  06/19/2022   MAMMOGRAM  06/26/2022   TETANUS/TDAP  02/09/2027   COLONOSCOPY (Pts 45-66yrs Insurance coverage will need to  be confirmed)  11/28/2029   Pneumonia Vaccine 44+ Years old  Completed   DEXA SCAN  Completed   Hepatitis C Screening  Completed   HPV VACCINES  Aged Out        Assessment  This is a routine wellness examination for Krista Love.  Health Maintenance: Due or Overdue There are no preventive care reminders to display for this patient.   Krista Love does not need a referral for Community Assistance: Care Management:   no Social Work:    no Prescription Assistance:  no Nutrition/Diabetes Education:  no   Plan:  Personalized Goals  Goals Addressed               This Visit's Progress     Patient Stated (pt-stated)        10/16/2021 AWV Goal: Exercise for General Health  Patient will verbalize understanding of the benefits of increased physical activity: Exercising regularly is important. It will improve your overall fitness, flexibility, and endurance. Regular exercise also will improve your overall health. It can help you control your weight, reduce stress, and improve your bone density. Over the next year, patient will increase physical activity as tolerated with a goal of at least 150 minutes of moderate physical activity per week.  You can tell that you are exercising at a moderate intensity if your heart starts beating faster and you start breathing faster but can still hold a conversation. Moderate-intensity exercise ideas include: Walking 1 mile (1.6 km) in about 15 minutes Biking Hiking Golfing Dancing Water aerobics Patient will verbalize understanding of everyday activities that increase physical activity by providing examples like the following: Yard work, such as: Sales promotion account executive Gardening Washing windows or floors Patient will be able to explain general safety guidelines for exercising:  Before you start a new exercise program, talk with your health care provider. Do not exercise so much that you hurt yourself, feel dizzy, or get very short of breath. Wear comfortable clothes and wear shoes with good support. Drink plenty of water  while you exercise to prevent dehydration or heat stroke. Work out until your breathing and your heartbeat get faster.        Personalized Health Maintenance & Screening Recommendations  Influenza vaccine Bone density scan Mammogram - scheduled Shingrix- 2nd dose  Lung Cancer Screening Recommended: completed and scheduled for next year. (Low Dose CT Chest recommended if Age 91-80 years, 30 pack-year currently smoking OR have quit w/in past 15 years) Hepatitis C Screening recommended: no HIV Screening recommended: no  Advanced Directives: Written information was not prepared per patient's request.  Referrals & Orders No orders of the defined types were placed in this encounter.   Follow-up Plan Follow-up with Hali Marry, MD as planned Referral for bone density scan has been sent.  Please bring your records regarding shingles vaccine to the office.  Medicare wellness visit in one year. AVS printed and mailed to the patient.   I have personally reviewed and noted the following in the patient's chart:   Medical and social history Use of alcohol, tobacco or illicit drugs  Current medications and supplements Functional ability and status Nutritional status Physical activity Advanced directives List of other physicians Hospitalizations, surgeries, and ER visits in previous 12 months Vitals Screenings to include cognitive, depression, and falls Referrals and appointments  In addition, I have reviewed and discussed with Krista Love certain  preventive protocols, quality metrics, and best practice recommendations. A written personalized care plan for preventive services as well as general preventive health recommendations is available and can be mailed to the patient at her request.      Tinnie Gens, RN BSN  10/16/2021

## 2021-10-16 NOTE — Patient Instructions (Addendum)
[place] Maintenance Summary and Written Plan of Care  Ms. Krista Love ,  Thank you for allowing me to perform your Medicare Annual Wellness Visit and for your ongoing commitment to your health.   Health Maintenance & Immunization History Health Maintenance  Topic Date Due  . COVID-19 Vaccine (3 - Moderna risk series) 11/01/2021 (Originally 01/04/2020)  . Zoster Vaccines- Shingrix (2 of 2) 01/15/2022 (Originally 04/27/2021)  . INFLUENZA VACCINE  04/18/2022 (Originally 08/18/2021)  . HEMOGLOBIN A1C  12/18/2021  . Diabetic kidney evaluation - GFR measurement  02/19/2022  . OPHTHALMOLOGY EXAM  05/21/2022  . Diabetic kidney evaluation - Urine ACR  06/19/2022  . FOOT EXAM  06/19/2022  . MAMMOGRAM  06/26/2022  . TETANUS/TDAP  02/09/2027  . COLONOSCOPY (Pts 45-72yrs Insurance coverage will need to be confirmed)  11/28/2029  . Pneumonia Vaccine 5+ Years old  Completed  . DEXA SCAN  Completed  . Hepatitis C Screening  Completed  . HPV VACCINES  Aged Out   Immunization History  Administered Date(s) Administered  . Moderna SARS-COV2 Booster Vaccination 12/07/2019  . Moderna Sars-Covid-2 Vaccination 03/03/2019, 03/31/2019  . PNEUMOCOCCAL CONJUGATE-20 05/27/2020  . Pneumococcal Polysaccharide-23 11/19/2014  . Td 01/18/2006  . Tdap 02/08/2017  . Zoster Recombinat (Shingrix) 03/02/2021  . Zoster, Live 09/12/2013    These are the patient goals that we discussed:  Goals Addressed              This Visit's Progress   .  Patient Stated (pt-stated)        10/16/2021 AWV Goal: Exercise for General Health  Patient will verbalize understanding of the benefits of increased physical activity: Exercising regularly is important. It will improve your overall fitness, flexibility, and endurance. Regular exercise also will improve your overall health. It can help you control your weight, reduce stress, and improve your bone density. Over the next year, patient will  increase physical activity as tolerated with a goal of at least 150 minutes of moderate physical activity per week.  You can tell that you are exercising at a moderate intensity if your heart starts beating faster and you start breathing faster but can still hold a conversation. Moderate-intensity exercise ideas include: Walking 1 mile (1.6 km) in about 15 minutes Biking Hiking Golfing Dancing Water aerobics Patient will verbalize understanding of everyday activities that increase physical activity by providing examples like the following: Yard work, such as: Sales promotion account executive Gardening Washing windows or floors Patient will be able to explain general safety guidelines for exercising:  Before you start a new exercise program, talk with your health care provider. Do not exercise so much that you hurt yourself, feel dizzy, or get very short of breath. Wear comfortable clothes and wear shoes with good support. Drink plenty of water while you exercise to prevent dehydration or heat stroke. Work out until your breathing and your heartbeat get faster.         This is a list of Health Maintenance Items that are overdue or due now: Influenza vaccine Bone density scan Mammogram  Shingrix- 2nd dose  Orders/Referrals Placed Today: Orders Placed This Encounter  Procedures  . DEXAScan    Standing Status:   Future    Standing Expiration Date:   10/17/2022    Scheduling Instructions:     Please call patient to schedule. She is scheduled for her mammoram on 10/22/21    Order Specific  Question:   Reason for exam:    Answer:   Post menopausal    Order Specific Question:   Preferred imaging location?    Answer:   [facility]    (Contact our referral department at [phone] if you have not spoken with someone about your referral appointment within the next 5 days)    Follow-up Plan Follow-up  with Hali Marry, MD as planned Referral for bone density scan has been sent.  Please bring your records regarding shingles vaccine to the office.  Medicare wellness visit in one year. AVS printed and mailed to the patient.      Health Maintenance, Female Adopting a healthy lifestyle and getting preventive care are important in promoting health and wellness. Ask your health care provider about: The right schedule for you to have regular tests and exams. Things you can do on your own to prevent diseases and keep yourself healthy. What should I know about diet, weight, and exercise? Eat a healthy diet  Eat a diet that includes plenty of vegetables, fruits, low-fat dairy products, and lean protein. Do not eat a lot of foods that are high in solid fats, added sugars, or sodium. Maintain a healthy weight Body mass index (BMI) is used to identify weight problems. It estimates body fat based on height and weight. Your health care provider can help determine your BMI and help you achieve or maintain a healthy weight. Get regular exercise Get regular exercise. This is one of the most important things you can do for your health. Most adults should: Exercise for at least 150 minutes each week. The exercise should increase your heart rate and make you sweat (moderate-intensity exercise). Do strengthening exercises at least twice a week. This is in addition to the moderate-intensity exercise. Spend less time sitting. Even light physical activity can be beneficial. Watch cholesterol and blood lipids Have your blood tested for lipids and cholesterol at 68 years of age, then have this test every 5 years. Have your cholesterol levels checked more often if: Your lipid or cholesterol levels are high. You are older than 68 years of age. You are at high risk for heart disease. What should I know about cancer screening? Depending on your health history and family history, you may need to have  cancer screening at various ages. This may include screening for: Breast cancer. Cervical cancer. Colorectal cancer. Skin cancer. Lung cancer. What should I know about heart disease, diabetes, and high blood pressure? Blood pressure and heart disease High blood pressure causes heart disease and increases the risk of stroke. This is more likely to develop in people who have high blood pressure readings or are overweight. Have your blood pressure checked: Every 3-5 years if you are 74-35 years of age. Every year if you are 69 years old or older. Diabetes Have regular diabetes screenings. This checks your fasting blood sugar level. Have the screening done: Once every three years after age 44 if you are at a normal weight and have a low risk for diabetes. More often and at a younger age if you are overweight or have a high risk for diabetes. What should I know about preventing infection? Hepatitis B If you have a higher risk for hepatitis B, you should be screened for this virus. Talk with your health care provider to find out if you are at risk for hepatitis B infection. Hepatitis C Testing is recommended for: Everyone born from [birth year] through 1967. Anyone with known risk factors  for hepatitis C. Sexually transmitted infections (STIs) Get screened for STIs, including gonorrhea and chlamydia, if: You are sexually active and are younger than 68 years of age. You are older than 68 years of age and your health care provider tells you that you are at risk for this type of infection. Your sexual activity has changed since you were last screened, and you are at increased risk for chlamydia or gonorrhea. Ask your health care provider if you are at risk. Ask your health care provider about whether you are at high risk for HIV. Your health care provider may recommend a prescription medicine to help prevent HIV infection. If you choose to take medicine to prevent HIV, you should first get tested for HIV.  You should then be tested every 3 months for as long as you are taking the medicine. Pregnancy If you are about to stop having your period (premenopausal) and you may become pregnant, seek counseling before you get pregnant. Take 400 to 800 micrograms (mcg) of folic acid every day if you become pregnant. Ask for birth control (contraception) if you want to prevent pregnancy. Osteoporosis and menopause Osteoporosis is a disease in which the bones lose minerals and strength with aging. This can result in bone fractures. If you are 10 years old or older, or if you are at risk for osteoporosis and fractures, ask your health care provider if you should: Be screened for bone loss. Take a calcium or vitamin D supplement to lower your risk of fractures. Be given hormone replacement therapy (HRT) to treat symptoms of menopause. Follow these instructions at home: Alcohol use Do not drink alcohol if: Your health care provider tells you not to drink. You are pregnant, may be pregnant, or are planning to become pregnant. If you drink alcohol: Limit how much you have to: 0-1 drink a day. Know how much alcohol is in your drink. In the [place] one drink equals one 12 oz bottle of beer (355 mL), one 5 oz glass of wine (148 mL), or one 1 oz glass of hard liquor (44 mL). Lifestyle Do not use any products that contain nicotine or tobacco. These products include cigarettes, chewing tobacco, and vaping devices, such as e-cigarettes. If you need help quitting, ask your health care provider. Do not use street drugs. Do not share needles. Ask your health care provider for help if you need support or information about quitting drugs. General instructions Schedule regular health, dental, and eye exams. Stay current with your vaccines. Tell your health care provider if: You often feel depressed. You have ever been abused or do not feel safe at home. Summary Adopting a healthy lifestyle and getting preventive care  are important in promoting health and wellness. Follow your health care provider's instructions about healthy diet, exercising, and getting tested or screened for diseases. Follow your health care provider's instructions on monitoring your cholesterol and blood pressure. This information is not intended to replace advice given to you by your health care provider. Make sure you discuss any questions you have with your health care provider. Document Revised: 05/26/2020 Document Reviewed: 05/26/2020 Elsevier Patient Education  [place].

## 2021-10-19 ENCOUNTER — Ambulatory Visit (INDEPENDENT_AMBULATORY_CARE_PROVIDER_SITE_OTHER): Payer: Medicare Other | Admitting: Family Medicine

## 2021-10-19 ENCOUNTER — Encounter: Payer: Self-pay | Admitting: Family Medicine

## 2021-10-19 VITALS — BP 124/60 | HR 78 | Ht 63.0 in | Wt 136.0 lb

## 2021-10-19 DIAGNOSIS — I10 Essential (primary) hypertension: Secondary | ICD-10-CM

## 2021-10-19 DIAGNOSIS — Z780 Asymptomatic menopausal state: Secondary | ICD-10-CM

## 2021-10-19 DIAGNOSIS — E119 Type 2 diabetes mellitus without complications: Secondary | ICD-10-CM | POA: Diagnosis not present

## 2021-10-19 DIAGNOSIS — R051 Acute cough: Secondary | ICD-10-CM | POA: Diagnosis not present

## 2021-10-19 DIAGNOSIS — R202 Paresthesia of skin: Secondary | ICD-10-CM

## 2021-10-19 DIAGNOSIS — J439 Emphysema, unspecified: Secondary | ICD-10-CM

## 2021-10-19 DIAGNOSIS — Z720 Tobacco use: Secondary | ICD-10-CM | POA: Diagnosis not present

## 2021-10-19 DIAGNOSIS — R200 Anesthesia of skin: Secondary | ICD-10-CM

## 2021-10-19 DIAGNOSIS — F341 Dysthymic disorder: Secondary | ICD-10-CM

## 2021-10-19 DIAGNOSIS — Z2911 Encounter for prophylactic immunotherapy for respiratory syncytial virus (RSV): Secondary | ICD-10-CM

## 2021-10-19 LAB — POCT GLYCOSYLATED HEMOGLOBIN (HGB A1C): Hemoglobin A1C: 6 % — AB (ref 4.0–5.6)

## 2021-10-19 MED ORDER — AMBULATORY NON FORMULARY MEDICATION: 0 refills | Status: DC

## 2021-10-19 NOTE — Assessment & Plan Note (Signed)
Well controlled. Continue current regimen. Follow up in  6 mo  

## 2021-10-19 NOTE — Assessment & Plan Note (Signed)
Right now she is on venlafaxine.  We could always make an adjustment to her regimen.  She is not sure talking to somebody will be helpful as she cannot quite pinpoint what is making her feel more down.  PHQ-9 score of 4 today and GAD-7 score of 3.

## 2021-10-19 NOTE — Progress Notes (Signed)
Established Patient Office Visit  Subjective   Patient ID: Krista Love, female    DOB: [birth year]  Age: 66 y.o. MRN: [id number]  Chief Complaint  Patient presents with   Diabetes   Hypertension    HPI  Hypertension- Pt denies chest pain, SOB, dizziness, or heart palpitations.  Taking meds as directed w/o problems.  Denies medication side effects.    Diabetes - no hypoglycemic events. No wounds or sores that are not healing well. No increased thirst or urination. Checking glucose at home. Taking medications as prescribed without any side effects.  She would also like to discuss smoking cessation today.  History of emphysema.  Has also been getting some numbness and tingling in her arms mostly at night when she wakes up she is able to straighten her elbows and get relief.  She is already had carpal tunnel surgery bilaterally so she is not sure what is causing it.     ROS    Objective:     BP 124/60   Pulse 78   Ht 5\' 3"  (1.6 m)   Wt 136 lb (61.7 kg)   SpO2 99%   BMI 24.09 kg/m    Physical Exam Vitals and nursing note reviewed.  Constitutional:      Appearance: She is well-developed.  HENT:     Head: Normocephalic and atraumatic.     Right Ear: External ear normal.     Left Ear: External ear normal.     Nose: Nose normal.  Eyes:     Conjunctiva/sclera: Conjunctivae normal.     Pupils: Pupils are equal, round, and reactive to light.  Neck:     Thyroid: No thyromegaly.  Cardiovascular:     Rate and Rhythm: Normal rate and regular rhythm.     Heart sounds: Normal heart sounds.  Pulmonary:     Effort: Pulmonary effort is normal.     Breath sounds: Normal breath sounds. No wheezing.  Musculoskeletal:     Cervical back: Neck supple.  Lymphadenopathy:     Cervical: No cervical adenopathy.  Skin:    General: Skin is warm and dry.  Neurological:     Mental Status: She is alert and oriented to person, place, and time.  Psychiatric:        Behavior: Behavior  normal.     Results for orders placed or performed in visit on 10/19/21  POCT glycosylated hemoglobin (Hb A1C)  Result Value Ref Range   Hemoglobin A1C 6.0 (A) 4.0 - 5.6 %   HbA1c POC (<> result, manual entry)     HbA1c, POC (prediabetic range)     HbA1c, POC (controlled diabetic range)        The 10-year ASCVD risk score (Arnett DK, et al., 2019) is: 26.2%    Assessment & Plan:   Problem List Items Addressed This Visit       Cardiovascular and Mediastinum   HYPERTENSION, BENIGN - Primary    Well controlled. Continue current regimen. Follow up in  6 mo       Relevant Orders   BASIC METABOLIC PANEL WITH GFR     Respiratory   Emphysema lung (HCC)     Endocrine   Diabetes type 2, controlled ([place])    Well controlled. Continue current regimen. Follow up in  6 mo       Relevant Orders   POCT glycosylated hemoglobin (Hb A1C) (Completed)   BASIC METABOLIC PANEL WITH GFR     Other  Tobacco abuse    We discussed options for smoking cessation including cutting back, nicotine replacement and prescription medication.  Now she wants to work on just cutting back.      ANXIETY DEPRESSION    Right now she is on venlafaxine.  We could always make an adjustment to her regimen.  She is not sure talking to somebody will be helpful as she cannot quite pinpoint what is making her feel more down.  PHQ-9 score of 4 today and GAD-7 score of 3.      Other Visit Diagnoses     Post-menopausal       Relevant Orders   DG Bone Density   Need for RSV immunization       Relevant Medications   AMBULATORY NON FORMULARY MEDICATION   Acute cough       Bilateral arm numbness and tingling while sleeping       Relevant Orders   Ambulatory referral to Neurology      Cough -Started Labor Day she says it is gradually getting better it is definitely better than it was 2 to 3 weeks ago.  We discussed that if it does not continue to get better in the next couple of weeks I would like to get a  chest x-ray.  Lung exam is clear.  She has been taking allergy medicine.  Negative for COVID.  No follow-ups on file.    Nani Gasser, MD

## 2021-10-19 NOTE — Assessment & Plan Note (Signed)
We discussed options for smoking cessation including cutting back, nicotine replacement and prescription medication.  Now she wants to work on just cutting back.

## 2021-10-20 LAB — BASIC METABOLIC PANEL WITH GFR
BUN: 11 mg/dL (ref 7–25)
CO2: 26 mmol/L (ref 20–32)
Calcium: 10 mg/dL (ref 8.6–10.4)
Chloride: 103 mmol/L (ref 98–110)
Creat: 0.65 mg/dL (ref 0.50–1.05)
Glucose, Bld: 119 mg/dL — ABNORMAL HIGH (ref 65–99)
Potassium: 4.2 mmol/L (ref 3.5–5.3)
Sodium: 140 mmol/L (ref 135–146)
eGFR: 96 mL/min/{1.73_m2} (ref >=60)

## 2021-10-20 NOTE — Progress Notes (Signed)
Your lab work is within acceptable range and there are no concerning findings.   ?

## 2021-10-22 ENCOUNTER — Ambulatory Visit: Payer: Medicare Other

## 2021-10-28 ENCOUNTER — Ambulatory Visit (INDEPENDENT_AMBULATORY_CARE_PROVIDER_SITE_OTHER): Payer: Medicare Other

## 2021-10-28 DIAGNOSIS — Z13820 Encounter for screening for osteoporosis: Secondary | ICD-10-CM

## 2021-10-28 DIAGNOSIS — Z780 Asymptomatic menopausal state: Secondary | ICD-10-CM | POA: Diagnosis not present

## 2021-10-28 DIAGNOSIS — Z1231 Encounter for screening mammogram for malignant neoplasm of breast: Secondary | ICD-10-CM

## 2021-10-28 NOTE — Progress Notes (Signed)
Ulis Rias, great news your bone density is within the normal range.  Continue to work on regular exercise and making sure you are getting an adequate calcium and vitamin D to keep your bones strong.

## 2021-10-29 NOTE — Progress Notes (Signed)
Please call patient. Normal mammogram.  Repeat in 1 year.  

## 2021-11-03 NOTE — Progress Notes (Signed)
Hi Kaye, bone density looks fantastic it is actually in the normal range.  Recommend repeat bone density in about 5 years.

## 2021-11-24 ENCOUNTER — Encounter: Payer: Self-pay | Admitting: Family Medicine

## 2021-11-24 DIAGNOSIS — G4700 Insomnia, unspecified: Secondary | ICD-10-CM

## 2021-11-25 NOTE — Telephone Encounter (Signed)
Rx pended:  Last OV: 10/19/21 Next OV: 02/22/22 Last RF:  08/28/21

## 2021-11-26 MED ORDER — ZOLPIDEM TARTRATE 5 MG PO TABS: 5.0000 mg | ORAL_TABLET | Freq: Every evening | ORAL | 0 refills | Status: DC | PRN

## 2021-12-03 DIAGNOSIS — M205X2 Other deformities of toe(s) (acquired), left foot: Secondary | ICD-10-CM | POA: Diagnosis not present

## 2021-12-03 DIAGNOSIS — M19079 Primary osteoarthritis, unspecified ankle and foot: Secondary | ICD-10-CM | POA: Diagnosis not present

## 2021-12-16 ENCOUNTER — Other Ambulatory Visit: Payer: Self-pay | Admitting: Family Medicine

## 2021-12-16 DIAGNOSIS — E785 Hyperlipidemia, unspecified: Secondary | ICD-10-CM

## 2021-12-17 ENCOUNTER — Other Ambulatory Visit: Payer: Self-pay | Admitting: Family Medicine

## 2021-12-17 DIAGNOSIS — I10 Essential (primary) hypertension: Secondary | ICD-10-CM

## 2021-12-31 DIAGNOSIS — R200 Anesthesia of skin: Secondary | ICD-10-CM | POA: Diagnosis not present

## 2022-02-20 ENCOUNTER — Encounter: Payer: Self-pay | Admitting: Family Medicine

## 2022-02-20 DIAGNOSIS — G4700 Insomnia, unspecified: Secondary | ICD-10-CM

## 2022-02-22 ENCOUNTER — Ambulatory Visit: Payer: Medicare Other | Admitting: Family Medicine

## 2022-02-22 MED ORDER — ZOLPIDEM TARTRATE 5 MG PO TABS: 5.0000 mg | ORAL_TABLET | Freq: Every evening | ORAL | 0 refills | Status: DC | PRN

## 2022-02-22 NOTE — Telephone Encounter (Signed)
Meds ordered this encounter  Medications   zolpidem (AMBIEN) 5 MG tablet    Sig: Take 1 tablet (5 mg total) by mouth at bedtime as needed for sleep.    Dispense:  90 tablet    Refill:  0

## 2022-03-08 ENCOUNTER — Ambulatory Visit (INDEPENDENT_AMBULATORY_CARE_PROVIDER_SITE_OTHER): Payer: Medicare Other | Admitting: Family Medicine

## 2022-03-08 ENCOUNTER — Encounter: Payer: Self-pay | Admitting: Family Medicine

## 2022-03-08 VITALS — BP 133/82 | HR 94 | Ht 63.0 in | Wt 138.0 lb

## 2022-03-08 DIAGNOSIS — Z79899 Other long term (current) drug therapy: Secondary | ICD-10-CM | POA: Diagnosis not present

## 2022-03-08 DIAGNOSIS — E785 Hyperlipidemia, unspecified: Secondary | ICD-10-CM

## 2022-03-08 DIAGNOSIS — R10823 Right lower quadrant rebound abdominal tenderness: Secondary | ICD-10-CM

## 2022-03-08 DIAGNOSIS — I10 Essential (primary) hypertension: Secondary | ICD-10-CM | POA: Diagnosis not present

## 2022-03-08 DIAGNOSIS — E119 Type 2 diabetes mellitus without complications: Secondary | ICD-10-CM | POA: Diagnosis not present

## 2022-03-08 DIAGNOSIS — R140 Abdominal distension (gaseous): Secondary | ICD-10-CM | POA: Diagnosis not present

## 2022-03-08 DIAGNOSIS — Z1329 Encounter for screening for other suspected endocrine disorder: Secondary | ICD-10-CM

## 2022-03-08 LAB — POCT GLYCOSYLATED HEMOGLOBIN (HGB A1C): Hemoglobin A1C: 6.5 % — AB (ref 4.0–5.6)

## 2022-03-08 NOTE — Progress Notes (Signed)
Established Patient Office Visit  Subjective   Patient ID: Krista Love, female    DOB: 10/21/1955  Age: 67 y.o. MRN: [id number]  Chief Complaint  Patient presents with   Diabetes    HPI  Diabetes - no hypoglycemic events. No wounds or sores that are not healing well. No increased thirst or urination. Checking glucose at home. Taking medications as prescribed without any side effects.  Currently on metformin.  She has had more stomach bloating and lower back pain.  It has been going on for a while she just feels almost like she is bloated and at the skin felt stretched over her abdomen.  She feels like her bowels are moving normally.  She feels more down and depressed.  She says most days she feels okay but other days she just feels down.  She says the only time she really gets out of the house is to go to the grocery store.  She does have a friend nearby but she is married and so does not spend a lot of time with her.  Her son lives in [place].  No other significant support system.  He does enjoy getting out gardening but it is wintertime.    ROS    Objective:     BP 133/82 (BP Location: Left Arm, Patient Position: Sitting, Cuff Size: Small)   Pulse 94   Ht 5' 3"$  (1.6 m)   Wt 138 lb (62.6 kg)   SpO2 98%   BMI 24.45 kg/m    Physical Exam Vitals and nursing note reviewed.  Constitutional:      Appearance: She is well-developed.  HENT:     Head: Normocephalic and atraumatic.  Cardiovascular:     Rate and Rhythm: Normal rate and regular rhythm.     Heart sounds: Normal heart sounds.  Pulmonary:     Effort: Pulmonary effort is normal.     Breath sounds: Normal breath sounds.  Skin:    General: Skin is warm and dry.  Neurological:     Mental Status: She is alert and oriented to person, place, and time.  Psychiatric:        Behavior: Behavior normal.      Results for orders placed or performed in visit on 03/08/22  POCT glycosylated hemoglobin (Hb A1C)   Result Value Ref Range   Hemoglobin A1C 6.5 (A) 4.0 - 5.6 %   HbA1c POC (<> result, manual entry)     HbA1c, POC (prediabetic range)     HbA1c, POC (controlled diabetic range)        The 10-year ASCVD risk score (Arnett DK, et al., 2019) is: 29.5%    Assessment & Plan:   Problem List Items Addressed This Visit       Cardiovascular and Mediastinum   HYPERTENSION, BENIGN    Pressure looks looks okay today.  She is in a start measuring blood pressure at home and bring in her cuff at the next visit she would really like to be able to come down to get off the medication for her blood pressure if at all possible.      Relevant Orders   COMPLETE METABOLIC PANEL WITH GFR     Endocrine   Diabetes type 2, controlled ([place]) - Primary    A1c looks good today at 6.5.  Though it is up a little bit from previous of 6.0 in the fall.  Metformin could be contributing to some of her GI symptoms so we certainly  could discuss an alternative. we discussed decreasing the metformin down to 1 tab a day to see if her GI symptoms improved..  Continue to work on healthy diet and regular exercise trying to increase activity level.      Relevant Orders   POCT glycosylated hemoglobin (Hb A1C) (Completed)   COMPLETE METABOLIC PANEL WITH GFR     Other   Hyperlipidemia   Relevant Orders   Lipid Panel w/reflex Direct LDL   Other Visit Diagnoses     Thyroid disorder screen       Relevant Orders   TSH   Medication management       Relevant Orders   POCT glycosylated hemoglobin (Hb A1C) (Completed)   TSH   Lipid Panel w/reflex Direct LDL   COMPLETE METABOLIC PANEL WITH GFR   CBC with Differential/Platelet   Right lower quadrant abdominal tenderness with rebound tenderness       Relevant Orders   US Abdomen Complete   US Pelvic Complete With Transvaginal   Bloating       Relevant Orders   US Abdomen Complete   US Pelvic Complete With Transvaginal       Return in about 3 weeks (around  03/29/2022) for Medication change.    Beatrice Lecher, MD

## 2022-03-08 NOTE — Assessment & Plan Note (Addendum)
A1c looks good today at 6.5.  Though it is up a little bit from previous of 6.0 in the fall.  Metformin could be contributing to some of her GI symptoms so we certainly could discuss an alternative. we discussed decreasing the metformin down to 1 tab a day to see if her GI symptoms improved..  Continue to work on healthy diet and regular exercise trying to increase activity level.

## 2022-03-08 NOTE — Assessment & Plan Note (Signed)
Pressure looks looks okay today.  She is in a start measuring blood pressure at home and bring in her cuff at the next visit she would really like to be able to come down to get off the medication for her blood pressure if at all possible.

## 2022-03-09 ENCOUNTER — Encounter: Payer: Self-pay | Admitting: Family Medicine

## 2022-03-09 LAB — CBC WITH DIFFERENTIAL/PLATELET
Absolute Monocytes: 666 cells/uL (ref 200–950)
Basophils Absolute: 22 cells/uL (ref 0–200)
Basophils Relative: 0.3 %
Eosinophils Absolute: 192 cells/uL (ref 15–500)
Eosinophils Relative: 2.6 %
HCT: 41.8 % (ref 35.0–45.0)
Hemoglobin: 13.8 g/dL (ref 11.7–15.5)
Lymphs Abs: 3012 cells/uL (ref 850–3900)
MCH: 30.2 pg (ref 27.0–33.0)
MCHC: 33 g/dL (ref 32.0–36.0)
MCV: 91.5 fL (ref 80.0–100.0)
MPV: 9.1 fL (ref 7.5–12.5)
Monocytes Relative: 9 %
Neutro Abs: 3508 cells/uL (ref 1500–7800)
Neutrophils Relative %: 47.4 %
Platelets: 341 10*3/uL (ref 140–400)
RBC: 4.57 10*6/uL (ref 3.80–5.10)
RDW: 12 % (ref 11.0–15.0)
Total Lymphocyte: 40.7 %
WBC: 7.4 10*3/uL (ref 3.8–10.8)

## 2022-03-09 LAB — COMPLETE METABOLIC PANEL WITH GFR
AG Ratio: 1.9 (calc) (ref 1.0–2.5)
ALT: 15 U/L (ref 6–29)
AST: 15 U/L (ref 10–35)
Albumin: 4.7 g/dL (ref 3.6–5.1)
Alkaline phosphatase (APISO): 75 U/L (ref 37–153)
BUN: 13 mg/dL (ref 7–25)
CO2: 28 mmol/L (ref 20–32)
Calcium: 10.5 mg/dL — ABNORMAL HIGH (ref 8.6–10.4)
Chloride: 102 mmol/L (ref 98–110)
Creat: 0.68 mg/dL (ref 0.50–1.05)
Globulin: 2.5 g/dL (calc) (ref 1.9–3.7)
Glucose, Bld: 98 mg/dL (ref 65–99)
Potassium: 4.4 mmol/L (ref 3.5–5.3)
Sodium: 140 mmol/L (ref 135–146)
Total Bilirubin: 0.2 mg/dL (ref 0.2–1.2)
Total Protein: 7.2 g/dL (ref 6.1–8.1)
eGFR: 95 mL/min/{1.73_m2} (ref >=60)

## 2022-03-09 LAB — LIPID PANEL W/REFLEX DIRECT LDL
Cholesterol: 173 mg/dL (ref <200)
HDL: 67 mg/dL (ref >=50)
LDL Cholesterol (Calc): 75 mg/dL (calc)
Non-HDL Cholesterol (Calc): 106 mg/dL (calc) (ref <130)
Total CHOL/HDL Ratio: 2.6 (calc) (ref <5.0)
Triglycerides: 222 mg/dL — ABNORMAL HIGH (ref <150)

## 2022-03-09 LAB — TSH: TSH: 2.04 mIU/L (ref 0.40–4.50)

## 2022-03-09 NOTE — Progress Notes (Signed)
Hi Krista Love,  It was good to see you yesterday.  LDL cholesterol looks fantastic.  Triglycerides are up a little bit.  Continue to work on [employer].  Calcium was just a little borderline elevated that could be if you are taking a supplement but it is not in a worrisome range.  Liver, kidney, thyroid function are all stable.  Blood count is normal.

## 2022-03-11 ENCOUNTER — Ambulatory Visit (INDEPENDENT_AMBULATORY_CARE_PROVIDER_SITE_OTHER): Payer: Medicare Other

## 2022-03-11 DIAGNOSIS — R140 Abdominal distension (gaseous): Secondary | ICD-10-CM

## 2022-03-11 DIAGNOSIS — R10823 Right lower quadrant rebound abdominal tenderness: Secondary | ICD-10-CM | POA: Diagnosis not present

## 2022-03-11 NOTE — Progress Notes (Signed)
Hi Kaye Ultrasound overall looks good.  No concerning findings in the abdomen.  Kidneys look good.  No sign of aneurysm.  Liver looks within normal limits.  Nothing specific to explain the discomfort or bloating.

## 2022-03-15 ENCOUNTER — Ambulatory Visit (INDEPENDENT_AMBULATORY_CARE_PROVIDER_SITE_OTHER): Payer: Medicare Other

## 2022-03-15 DIAGNOSIS — R140 Abdominal distension (gaseous): Secondary | ICD-10-CM

## 2022-03-15 DIAGNOSIS — R10823 Right lower quadrant rebound abdominal tenderness: Secondary | ICD-10-CM

## 2022-03-15 DIAGNOSIS — R1032 Left lower quadrant pain: Secondary | ICD-10-CM | POA: Diagnosis not present

## 2022-03-15 DIAGNOSIS — R10814 Left lower quadrant abdominal tenderness: Secondary | ICD-10-CM | POA: Diagnosis not present

## 2022-03-16 ENCOUNTER — Encounter: Payer: Self-pay | Admitting: Family Medicine

## 2022-03-16 NOTE — Progress Notes (Signed)
Hi Kaye, ultrasound overall looks good.  The ovaries were not visualized which is reassuring they usually will travel after menopause and are more difficult to find so that is actually good.  They did not see any mass or anything else that look concerning.  I would recommend trying a probiotic to see if that helps with some of the bloating and the tenderness.  I recommend giving it a try for at least 3 weeks to see if you notice improvement.

## 2022-03-28 ENCOUNTER — Encounter: Payer: Self-pay | Admitting: Family Medicine

## 2022-03-29 ENCOUNTER — Ambulatory Visit: Payer: Medicare Other | Admitting: Family Medicine

## 2022-04-12 ENCOUNTER — Ambulatory Visit (INDEPENDENT_AMBULATORY_CARE_PROVIDER_SITE_OTHER): Payer: Medicare Other | Admitting: Family Medicine

## 2022-04-12 ENCOUNTER — Encounter: Payer: Self-pay | Admitting: Family Medicine

## 2022-04-12 VITALS — BP 121/64 | HR 85 | Ht 63.0 in | Wt 137.0 lb

## 2022-04-12 DIAGNOSIS — I700 Atherosclerosis of aorta: Secondary | ICD-10-CM | POA: Diagnosis not present

## 2022-04-12 DIAGNOSIS — R809 Proteinuria, unspecified: Secondary | ICD-10-CM

## 2022-04-12 DIAGNOSIS — Z720 Tobacco use: Secondary | ICD-10-CM | POA: Diagnosis not present

## 2022-04-12 DIAGNOSIS — I10 Essential (primary) hypertension: Secondary | ICD-10-CM | POA: Diagnosis not present

## 2022-04-12 DIAGNOSIS — F341 Dysthymic disorder: Secondary | ICD-10-CM | POA: Diagnosis not present

## 2022-04-12 DIAGNOSIS — R140 Abdominal distension (gaseous): Secondary | ICD-10-CM | POA: Diagnosis not present

## 2022-04-12 DIAGNOSIS — E1129 Type 2 diabetes mellitus with other diabetic kidney complication: Secondary | ICD-10-CM

## 2022-04-12 MED ORDER — VARENICLINE TARTRATE 1 MG PO TABS: ORAL_TABLET | ORAL | 1 refills | Status: DC

## 2022-04-12 MED ORDER — VENLAFAXINE HCL ER 75 MG PO CP24: 75.0000 mg | ORAL_CAPSULE | Freq: Every day | ORAL | 2 refills | Status: DC

## 2022-04-12 NOTE — Assessment & Plan Note (Signed)
The lisinopril 20 mg daily.  She will continue to monitor blood pressures.  They do start going under 120 we can always decrease the dose to 10 mg if needed.

## 2022-04-12 NOTE — Assessment & Plan Note (Signed)
Discussed chantix and how it works and potential side effects.  She would lke to start themedication.  New rx sent. Recommend a 3-4 mo treatment plan.

## 2022-04-12 NOTE — Progress Notes (Signed)
Established Patient Office Visit  Subjective   Patient ID: Krista Love, female    DOB: April 19, 1955  Age: 67 y.o. MRN: [id number]  Chief Complaint  Patient presents with   Hypertension    HPI  Tobacco abuse-we had briefly talked about Chantix when she was here last time and she wanted to discuss it further.  She is starting to feel motivated to quit.  Hypertension- Pt denies chest pain, SOB, dizziness, or heart palpitations.  Taking meds as directed w/o problems.  Denies medication side effects.  Been measuring her blood pressures at home and did bring in her home cuff today.  He did check her home blood pressures for about 2 weeks and then typically ran in the low 120s on her home blood pressure cuff.  Follow-up depression/anxiety-when I saw her in February we decided to increase the Effexor to 75 mg.  She feels like the bump in the dose has actually been really helpful.  PHQ-9 score of 2 and GAD-7 score of 2 as well.  She has not noticed any negative side effects.  Bloating-she did end up starting the probiotic align and says that it is a little bit better she still gets some bloating and gas but it is not as frequent as it was before.    ROS    Objective:     BP 121/64   Pulse 85   Ht 5\' 3"  (1.6 m)   Wt 137 lb (62.1 kg)   SpO2 100%   BMI 24.27 kg/m    Physical Exam Vitals and nursing note reviewed.  Constitutional:      Appearance: She is well-developed.  HENT:     Head: Normocephalic and atraumatic.  Cardiovascular:     Rate and Rhythm: Normal rate and regular rhythm.     Heart sounds: Normal heart sounds.  Pulmonary:     Effort: Pulmonary effort is normal.     Breath sounds: Normal breath sounds.  Skin:    General: Skin is warm and dry.  Neurological:     Mental Status: She is alert and oriented to person, place, and time.  Psychiatric:        Behavior: Behavior normal.      No results found for any visits on 04/12/22.    The 10-year ASCVD risk  score (Arnett DK, et al., 2019) is: 25%    Assessment & Plan:   Problem List Items Addressed This Visit       Cardiovascular and Mediastinum   HYPERTENSION, BENIGN - Primary    Well controlled. Continue current regimen. Follow up in  6 mo  The lisinopril 20 mg daily.  She will continue to monitor blood pressures.  They do start going under 120 we can always decrease the dose to 10 mg if needed.      Aortic atherosclerosis (HCC)    Continue Crestor 40 mg daily.        Endocrine   Microalbuminuria due to type 2 diabetes mellitus (HCC)    The lisinopril 20 mg daily.  She will continue to monitor blood pressures.  They do start going under 120 we can always decrease the dose to 10 mg if needed.        Other   Tobacco abuse    Discussed chantix and how it works and potential side effects.  She would lke to start themedication.  New rx sent. Recommend a 3-4 mo treatment plan.       Relevant Medications  varenicline (CHANTIX) 1 MG tablet   ANXIETY DEPRESSION   Relevant Medications   venlafaxine XR (EFFEXOR-XR) 75 MG 24 hr capsule   Other Visit Diagnoses     Bloating          Boating -we did discuss potential trial of elimination diet which could include dairy or gluten etc.  She will think about it.  Return in about 6 months (around 10/13/2022) for Mood and Sleep, recheck Calcium .    Beatrice Lecher, MD

## 2022-04-12 NOTE — Assessment & Plan Note (Addendum)
Well controlled. Continue current regimen. Follow up in  6 mo  The lisinopril 20 mg daily.  She will continue to monitor blood pressures.  They do start going under 120 we can always decrease the dose to 10 mg if needed.

## 2022-04-12 NOTE — Assessment & Plan Note (Signed)
Continue Crestor 40 mg daily.

## 2022-04-12 NOTE — Progress Notes (Signed)
She brought in her home BP cuff 133/79 p:83  difference of (5/15) between our blood pressure cuff reading   Pt reports that the increased dose of Effexor is working well for her she will need a new prescription sent for the 75mg  sent to her pharmacy

## 2022-05-19 ENCOUNTER — Encounter: Payer: Self-pay | Admitting: Family Medicine

## 2022-05-19 DIAGNOSIS — G4700 Insomnia, unspecified: Secondary | ICD-10-CM

## 2022-05-20 MED ORDER — ZOLPIDEM TARTRATE 5 MG PO TABS: 5.0000 mg | ORAL_TABLET | Freq: Every evening | ORAL | 0 refills | Status: DC | PRN

## 2022-05-20 NOTE — Telephone Encounter (Signed)
See other MyChart message

## 2022-05-20 NOTE — Telephone Encounter (Signed)
Meds ordered this encounter  Medications   zolpidem (AMBIEN) 5 MG tablet    Sig: Take 1 tablet (5 mg total) by mouth at bedtime as needed for sleep.    Dispense:  90 tablet    Refill:  0    

## 2022-06-07 DIAGNOSIS — R200 Anesthesia of skin: Secondary | ICD-10-CM | POA: Diagnosis not present

## 2022-07-12 ENCOUNTER — Encounter: Payer: Self-pay | Admitting: Family Medicine

## 2022-07-12 DIAGNOSIS — F341 Dysthymic disorder: Secondary | ICD-10-CM

## 2022-07-12 MED ORDER — VENLAFAXINE HCL ER 75 MG PO CP24: 75.0000 mg | ORAL_CAPSULE | Freq: Every day | ORAL | 2 refills | Status: DC

## 2022-08-15 ENCOUNTER — Encounter: Payer: Self-pay | Admitting: Family Medicine

## 2022-08-15 DIAGNOSIS — G4700 Insomnia, unspecified: Secondary | ICD-10-CM

## 2022-08-17 MED ORDER — ZOLPIDEM TARTRATE 5 MG PO TABS: 5.0000 mg | ORAL_TABLET | Freq: Every evening | ORAL | 0 refills | Status: DC | PRN

## 2022-08-23 ENCOUNTER — Ambulatory Visit (INDEPENDENT_AMBULATORY_CARE_PROVIDER_SITE_OTHER): Payer: Medicare Other

## 2022-08-23 DIAGNOSIS — Z122 Encounter for screening for malignant neoplasm of respiratory organs: Secondary | ICD-10-CM | POA: Diagnosis not present

## 2022-08-23 DIAGNOSIS — F17210 Nicotine dependence, cigarettes, uncomplicated: Secondary | ICD-10-CM

## 2022-08-23 DIAGNOSIS — Z87891 Personal history of nicotine dependence: Secondary | ICD-10-CM

## 2022-08-24 DIAGNOSIS — L820 Inflamed seborrheic keratosis: Secondary | ICD-10-CM | POA: Diagnosis not present

## 2022-08-24 DIAGNOSIS — L570 Actinic keratosis: Secondary | ICD-10-CM | POA: Diagnosis not present

## 2022-08-25 ENCOUNTER — Other Ambulatory Visit: Payer: Self-pay | Admitting: Family Medicine

## 2022-08-25 DIAGNOSIS — I10 Essential (primary) hypertension: Secondary | ICD-10-CM

## 2022-08-25 DIAGNOSIS — E785 Hyperlipidemia, unspecified: Secondary | ICD-10-CM

## 2022-08-30 ENCOUNTER — Other Ambulatory Visit: Payer: Self-pay

## 2022-08-30 DIAGNOSIS — Z87891 Personal history of nicotine dependence: Secondary | ICD-10-CM

## 2022-08-30 DIAGNOSIS — Z122 Encounter for screening for malignant neoplasm of respiratory organs: Secondary | ICD-10-CM

## 2022-08-30 DIAGNOSIS — F17210 Nicotine dependence, cigarettes, uncomplicated: Secondary | ICD-10-CM

## 2022-10-18 ENCOUNTER — Ambulatory Visit (INDEPENDENT_AMBULATORY_CARE_PROVIDER_SITE_OTHER): Payer: Medicare Other | Admitting: Family Medicine

## 2022-10-18 ENCOUNTER — Encounter: Payer: Self-pay | Admitting: Family Medicine

## 2022-10-18 VITALS — BP 115/67 | HR 101 | Ht 63.0 in | Wt 134.0 lb

## 2022-10-18 DIAGNOSIS — I700 Atherosclerosis of aorta: Secondary | ICD-10-CM | POA: Diagnosis not present

## 2022-10-18 DIAGNOSIS — I10 Essential (primary) hypertension: Secondary | ICD-10-CM

## 2022-10-18 DIAGNOSIS — G4700 Insomnia, unspecified: Secondary | ICD-10-CM | POA: Diagnosis not present

## 2022-10-18 DIAGNOSIS — R809 Proteinuria, unspecified: Secondary | ICD-10-CM | POA: Diagnosis not present

## 2022-10-18 DIAGNOSIS — Z720 Tobacco use: Secondary | ICD-10-CM

## 2022-10-18 DIAGNOSIS — E119 Type 2 diabetes mellitus without complications: Secondary | ICD-10-CM

## 2022-10-18 DIAGNOSIS — F341 Dysthymic disorder: Secondary | ICD-10-CM

## 2022-10-18 DIAGNOSIS — E1129 Type 2 diabetes mellitus with other diabetic kidney complication: Secondary | ICD-10-CM

## 2022-10-18 DIAGNOSIS — Z7984 Long term (current) use of oral hypoglycemic drugs: Secondary | ICD-10-CM | POA: Diagnosis not present

## 2022-10-18 LAB — POCT GLYCOSYLATED HEMOGLOBIN (HGB A1C): Hemoglobin A1C: 6.4 % — AB (ref 4.0–5.6)

## 2022-10-18 LAB — POCT UA - MICROALBUMIN
Creatinine, POC: 100 mg/dL
Microalbumin Ur, POC: 30 mg/L

## 2022-10-18 NOTE — Assessment & Plan Note (Addendum)
Has cut back to half a pack a day.  She did not tolerate the Chantix she felt like it was causing her to have a head tremor so she stopped it.  We discussed that Wellbutrin may be an option in the future but for right now she just wants to stay off anything new to see if the shaking comes back I think that is perfectly reasonable and if in a month or 2 she would like to consider Wellbutrin I am happy to send in a new prescription for her.

## 2022-10-18 NOTE — Assessment & Plan Note (Signed)
A1c looks great today at 6.4.  Continue metformin and dietary changes.  There is some trace protein with albumin creatinine ratio of 30:300 today.  She is on an ACE inhibitor.

## 2022-10-18 NOTE — Assessment & Plan Note (Signed)
Continue Crestor nightly.

## 2022-10-18 NOTE — Assessment & Plan Note (Signed)
Continue current dose of venlafaxine 75 mg daily.

## 2022-10-18 NOTE — Assessment & Plan Note (Signed)
Tolerating Ambien well.  Refills are up-to-date.

## 2022-10-18 NOTE — Progress Notes (Signed)
Established Patient Office Visit  Subjective   Patient ID: TERRE LESAR, female    DOB: Aug 30, 1955  Age: 67 y.o. MRN: [id number]  Chief Complaint  Patient presents with   Diabetes    HPI  Diabetes - no hypoglycemic events. No wounds or sores that are not healing well. No increased thirst or urination. Checking glucose at home. Taking medications as prescribed without any side effects.  Hypertension- Pt denies chest pain, SOB, dizziness, or heart palpitations.  Taking meds as directed w/o problems.  Denies medication side effects.    She did try Chantix for quitting smoking and said she had 2 episodes where a family member noted that her head was shaking or bobbing.  She felt like it was a medication side effect so she stopped it immediately she has not had it happen since then.  She has cut back on her smoking some.     ROS    Objective:     BP 115/67   Pulse (!) 101   Ht 5\' 3"  (1.6 m)   Wt 134 lb (60.8 kg)   SpO2 99%   BMI 23.74 kg/m    Physical Exam Vitals and nursing note reviewed.  Constitutional:      Appearance: Normal appearance.  HENT:     Head: Normocephalic and atraumatic.  Eyes:     Conjunctiva/sclera: Conjunctivae normal.  Cardiovascular:     Rate and Rhythm: Normal rate and regular rhythm.  Pulmonary:     Effort: Pulmonary effort is normal.     Breath sounds: Normal breath sounds.  Skin:    General: Skin is warm and dry.  Neurological:     Mental Status: She is alert.  Psychiatric:        Mood and Affect: Mood normal.      Results for orders placed or performed in visit on 10/18/22  POCT HgB A1C  Result Value Ref Range   Hemoglobin A1C 6.4 (A) 4.0 - 5.6 %   HbA1c POC (<> result, manual entry)     HbA1c, POC (prediabetic range)     HbA1c, POC (controlled diabetic range)    POCT UA - Microalbumin  Result Value Ref Range   Microalbumin Ur, POC 30 mg/L   Creatinine, POC 100 mg/dL   Albumin/Creatinine Ratio, Urine, POC 30-300        The 10-year ASCVD risk score (Arnett DK, et al., 2019) is: 25%    Assessment & Plan:   Problem List Items Addressed This Visit       Cardiovascular and Mediastinum   HYPERTENSION, BENIGN    Blood pressure looks fantastic today.  Continue current regimen.      Relevant Orders   CMP14+EGFR   Aortic atherosclerosis (HCC)    Continue Crestor nightly.        Endocrine   Microalbuminuria due to type 2 diabetes mellitus (HCC)    Knowing some mild protein today.  Continue with lisinopril 20 mg daily.      Diabetes type 2, controlled (HCC) - Primary    A1c looks great today at 6.4.  Continue metformin and dietary changes.  There is some trace protein with albumin creatinine ratio of 30:300 today.  She is on an ACE inhibitor.      Relevant Orders   POCT HgB A1C (Completed)   CMP14+EGFR   POCT UA - Microalbumin (Completed)     Other   Tobacco abuse    Has cut back to half a pack a  day.  She did not tolerate the Chantix she felt like it was causing her to have a head tremor so she stopped it.  We discussed that Wellbutrin may be an option in the future but for right now she just wants to stay off anything new to see if the shaking comes back I think that is perfectly reasonable and if in a month or 2 she would like to consider Wellbutrin I am happy to send in a new prescription for her.      Insomnia    Tolerating Ambien well.  Refills are up-to-date.      ANXIETY DEPRESSION    Continue current dose of venlafaxine 75 mg daily.       Return in about 18 weeks (around 02/21/2023) for Diabetes follow-up, Hypertension.    Nani Gasser, MD

## 2022-10-18 NOTE — Assessment & Plan Note (Signed)
Knowing some mild protein today.  Continue with lisinopril 20 mg daily.

## 2022-10-18 NOTE — Assessment & Plan Note (Signed)
Blood pressure looks fantastic today.  Continue current regimen.

## 2022-10-19 ENCOUNTER — Ambulatory Visit: Payer: Medicare Other | Admitting: Family Medicine

## 2022-10-19 DIAGNOSIS — Z0000 Encounter for general adult medical examination without abnormal findings: Secondary | ICD-10-CM | POA: Diagnosis not present

## 2022-10-19 LAB — CMP14+EGFR
ALT: 21 [IU]/L (ref 0–32)
AST: 17 [IU]/L (ref 0–40)
Albumin: 4.7 g/dL (ref 3.9–4.9)
Alkaline Phosphatase: 104 [IU]/L (ref 44–121)
BUN/Creatinine Ratio: 14 (ref 12–28)
BUN: 12 mg/dL (ref 8–27)
Bilirubin Total: <0.2 mg/dL (ref 0.0–1.2)
CO2: 23 mmol/L (ref 20–29)
Calcium: 9.7 mg/dL (ref 8.7–10.3)
Chloride: 98 mmol/L (ref 96–106)
Creatinine, Ser: 0.84 mg/dL (ref 0.57–1.00)
Globulin, Total: 2.3 g/dL (ref 1.5–4.5)
Glucose: 137 mg/dL — ABNORMAL HIGH (ref 70–99)
Potassium: 4.5 mmol/L (ref 3.5–5.2)
Sodium: 137 mmol/L (ref 134–144)
Total Protein: 7 g/dL (ref 6.0–8.5)
eGFR: 75 mL/min/{1.73_m2} (ref >59)

## 2022-10-19 LAB — HM DIABETES EYE EXAM

## 2022-10-19 NOTE — Patient Instructions (Addendum)
MEDICARE ANNUAL WELLNESS VISIT Health Maintenance Summary and Written Plan of Care  Krista Love ,  Thank you for allowing me to perform your Medicare Annual Wellness Visit and for your ongoing commitment to your health.   Health Maintenance & Immunization History Health Maintenance  Topic Date Due  . INFLUENZA VACCINE  04/18/2023 (Originally 08/19/2022)  . COVID-19 Vaccine (3 - Moderna risk series) 04/28/2023 (Originally 01/04/2020)  . HEMOGLOBIN A1C  04/17/2023  . OPHTHALMOLOGY EXAM  09/18/2023  . Diabetic kidney evaluation - eGFR measurement  10/18/2023  . Diabetic kidney evaluation - Urine ACR  10/18/2023  . FOOT EXAM  10/18/2023  . Medicare Annual Wellness (AWV)  10/19/2023  . MAMMOGRAM  10/29/2023  . DEXA SCAN  10/29/2026  . DTaP/Tdap/Td (3 - Td or Tdap) 02/09/2027  . Colonoscopy  11/28/2029  . Pneumonia Vaccine 31+ Years old  Completed  . Hepatitis C Screening  Completed  . Zoster Vaccines- Shingrix  Completed  . HPV VACCINES  Aged Out   Immunization History  Administered Date(s) Administered  . Moderna SARS-COV2 Booster Vaccination 12/07/2019  . Moderna Sars-Covid-2 Vaccination 03/03/2019, 03/31/2019  . PNEUMOCOCCAL CONJUGATE-20 05/27/2020  . Pneumococcal Polysaccharide-23 11/19/2014  . Td 01/18/2006  . Tdap 02/08/2017  . Zoster Recombinant(Shingrix) 03/02/2021, 08/29/2021  . Zoster, Live 09/12/2013    These are the patient goals that we discussed:  Goals Addressed              This Visit's Progress   .  Patient Stated (pt-stated)        10/19/2022 AWV Goal: Tobacco Cessation  Smoking cessation instruction/counseling given:  counseled patient on the dangers of tobacco use, advised patient to stop smoking, and reviewed strategies to maximize success  Patient will verbalize understanding of the health risks associated with smoking/tobacco use Lung cancer or lung disease, such as COPD Heart disease. Stroke. Heart attack Infertility Osteoporosis and bone  fractures. Patient will create a plan to quit smoking/using tobacco Pick a date to quit.  Write down the reasons why you are quitting and put it where you will see it often. Identify the people, places, things, and activities that make you want to smoke (triggers) and avoid them. Make sure to take these actions: Throw away all cigarettes at home, at work, and in your car. Throw away smoking accessories, such as Set designer. Clean your car and make sure to empty the ashtray. Clean your home, including curtains and carpets. Tell your family, friends, and coworkers that you are quitting. Support from your loved ones can make quitting easier. Talk with your health care provider about your options for quitting smoking. Find out what treatment options are covered by your health insurance. Patient will be able to demonstrate knowledge of tobacco cessation strategies that may maximize success Quitting "cold [place]" is more successful than gradually quitting. Attending in-person counseling to help you build problem-solving skills.  Finding resources and support systems that can help you to quit smoking and remain smoke-free after you quit. These resources are most helpful when you use them often. They can include: Online chats with a Veterinary surgeon. Telephone quitlines. Printed Materials engineer. Support groups or group counseling. Text messaging programs. Mobile phone applications. Taking medicines to help you quit smoking: Nicotine patches, gum, or lozenges. Nicotine inhalers or sprays. Non-nicotine medicine that is taken by mouth. Patient will note get discouraged if the process is difficult Over the next year, patient will stop smoking or using other forms of tobacco  Smoking cessation instruction/counseling  given:  counseled patient on the dangers of tobacco use, advised patient to stop smoking, and reviewed strategies to maximize success          This is a list of Health  Maintenance Items that are overdue or due now: Screening mammography - scheduled.  Orders/Referrals Placed Today: No orders of the defined types were placed in this encounter.  (Contact our referral department at [phone] if you have not spoken with someone about your referral appointment within the next 5 days)    Follow-up Plan Follow-up with Agapito Games, MD as planned Medicare wellness visit in one year.  AVS printed and mailed to the patient.       Health Maintenance, Female Adopting a healthy lifestyle and getting preventive care are important in promoting health and wellness. Ask your health care provider about: The right schedule for you to have regular tests and exams. Things you can do on your own to prevent diseases and keep yourself healthy. What should I know about diet, weight, and exercise? Eat a healthy diet  Eat a diet that includes plenty of vegetables, fruits, low-fat dairy products, and lean protein. Do not eat a lot of foods that are high in solid fats, added sugars, or sodium. Maintain a healthy weight Body mass index (BMI) is used to identify weight problems. It estimates body fat based on height and weight. Your health care provider can help determine your BMI and help you achieve or maintain a healthy weight. Get regular exercise Get regular exercise. This is one of the most important things you can do for your health. Most adults should: Exercise for at least 150 minutes each week. The exercise should increase your heart rate and make you sweat (moderate-intensity exercise). Do strengthening exercises at least twice a week. This is in addition to the moderate-intensity exercise. Spend less time sitting. Even light physical activity can be beneficial. Watch cholesterol and blood lipids Have your blood tested for lipids and cholesterol at 69 years of age, then have this test every 5 years. Have your cholesterol levels checked more often  if: Your lipid or cholesterol levels are high. You are older than 69 years of age. You are at high risk for heart disease. What should I know about cancer screening? Depending on your health history and family history, you may need to have cancer screening at various ages. This may include screening for: Breast cancer. Cervical cancer. Colorectal cancer. Skin cancer. Lung cancer. What should I know about heart disease, diabetes, and high blood pressure? Blood pressure and heart disease High blood pressure causes heart disease and increases the risk of stroke. This is more likely to develop in people who have high blood pressure readings or are overweight. Have your blood pressure checked: Every 3-5 years if you are 88-57 years of age. Every year if you are 90 years old or older. Diabetes Have regular diabetes screenings. This checks your fasting blood sugar level. Have the screening done: Once every three years after age 88 if you are at a normal weight and have a low risk for diabetes. More often and at a younger age if you are overweight or have a high risk for diabetes. What should I know about preventing infection? Hepatitis B If you have a higher risk for hepatitis B, you should be screened for this virus. Talk with your health care provider to find out if you are at risk for hepatitis B infection. Hepatitis C Testing is recommended for: Everyone  born from [birth year] through 1967. Anyone with known risk factors for hepatitis C. Sexually transmitted infections (STIs) Get screened for STIs, including gonorrhea and chlamydia, if: You are sexually active and are younger than 69 years of age. You are older than 69 years of age and your health care provider tells you that you are at risk for this type of infection. Your sexual activity has changed since you were last screened, and you are at increased risk for chlamydia or gonorrhea. Ask your health care provider if you are at risk. Ask  your health care provider about whether you are at high risk for HIV. Your health care provider may recommend a prescription medicine to help prevent HIV infection. If you choose to take medicine to prevent HIV, you should first get tested for HIV. You should then be tested every 3 months for as long as you are taking the medicine. Pregnancy If you are about to stop having your period (premenopausal) and you may become pregnant, seek counseling before you get pregnant. Take 400 to 800 micrograms (mcg) of folic acid every day if you become pregnant. Ask for birth control (contraception) if you want to prevent pregnancy. Osteoporosis and menopause Osteoporosis is a disease in which the bones lose minerals and strength with aging. This can result in bone fractures. If you are 91 years old or older, or if you are at risk for osteoporosis and fractures, ask your health care provider if you should: Be screened for bone loss. Take a calcium or vitamin D supplement to lower your risk of fractures. Be given hormone replacement therapy (HRT) to treat symptoms of menopause. Follow these instructions at home: Alcohol use Do not drink alcohol if: Your health care provider tells you not to drink. You are pregnant, may be pregnant, or are planning to become pregnant. If you drink alcohol: Limit how much you have to: 0-1 drink a day. Know how much alcohol is in your drink. In the [place] one drink equals one 12 oz bottle of beer (355 mL), one 5 oz glass of wine (148 mL), or one 1 oz glass of hard liquor (44 mL). Lifestyle Do not use any products that contain nicotine or tobacco. These products include cigarettes, chewing tobacco, and vaping devices, such as e-cigarettes. If you need help quitting, ask your health care provider. Do not use street drugs. Do not share needles. Ask your health care provider for help if you need support or information about quitting drugs. General instructions Schedule regular  health, dental, and eye exams. Stay current with your vaccines. Tell your health care provider if: You often feel depressed. You have ever been abused or do not feel safe at home. Summary Adopting a healthy lifestyle and getting preventive care are important in promoting health and wellness. Follow your health care provider's instructions about healthy diet, exercising, and getting tested or screened for diseases. Follow your health care provider's instructions on monitoring your cholesterol and blood pressure. This information is not intended to replace advice given to you by your health care provider. Make sure you discuss any questions you have with your health care provider. Document Revised: 05/26/2020 Document Reviewed: 05/26/2020 Elsevier Patient Education  2024 [employer].

## 2022-10-19 NOTE — Progress Notes (Signed)
Your lab work is within acceptable range and there are no concerning findings.   ?

## 2022-10-19 NOTE — Progress Notes (Signed)
MEDICARE ANNUAL WELLNESS VISIT  10/19/2022  Telephone Visit Disclaimer This Medicare AWV was conducted by telephone due to national recommendations for restrictions regarding the COVID-19 Pandemic (e.g. social distancing).  I verified, using two identifiers, that I am speaking with Krista Love or their authorized healthcare agent. I discussed the limitations, risks, security, and privacy concerns of performing an evaluation and management service by telephone and the potential availability of an in-person appointment in the future. The patient expressed understanding and agreed to proceed.  Location of Patient: Home Location of Provider (nurse):  Provider home  Subjective:    Krista Love is a 67 y.o. female patient of Metheney, Barbarann Ehlers, MD who had a Medicare Annual Wellness Visit today via telephone. Krista Love is Retired and lives with an adult companion. she has 1 child. she reports that she is socially active and does interact with friends/family regularly. she is moderately physically active and enjoys watching sports, cooking, reading and yardwork.  Patient Care Team: Agapito Games, MD as PCP - Stormy Fabian, OD as Referring Physician (Optometry) Gabriel Carina, [facility] as Pharmacist (Pharmacist)     10/19/2022    2:02 PM 10/16/2021    1:04 PM 05/27/2020    9:08 AM  Advanced Directives  Does Patient Have a Medical Advance Directive? Yes Yes Yes  Type of Advance Directive Living will Living will Living will;Healthcare Power of Attorney  Does patient want to make changes to medical advance directive? No - Patient declined No - Patient declined   Copy of Healthcare Power of Attorney in Chart?   No - copy requested    Hospital Utilization Over the Past 12 Months: # of hospitalizations or ER visits: 0 # of surgeries: 0  Review of Systems    Patient reports that her overall health is better compared to last year.  History obtained from chart review and the  patient  Patient Reported Readings (BP, Pulse, CBG, Weight, etc) none Per patient no change in vitals since last visit, unable to obtain new vitals due to telehealth visit  Pain Assessment Pain : No/denies pain     Current Medications & Allergies (verified) Allergies as of 10/19/2022       Reactions   Codeine Nausea Only        Medication List        Accurate as of October 19, 2022  2:15 PM. If you have any questions, ask your nurse or doctor.          Accu-Chek Guide test strip Generic drug: glucose blood USE TO CHECK BLOOD SUGAR IN THE MORNING AND 2 HOURS  AFTER LARGEST MEAL OF THE  DAY 3 TIMES WEEKLY   Accu-Chek Softclix Lancets lancets Dx DM E11.9. Check fasting blood sugar every morning and 2 hours after largest meal.   AMBULATORY NON FORMULARY MEDICATION Medication Name: please give RSV vaccine   lisinopril 20 MG tablet Commonly known as: ZESTRIL TAKE 1 TABLET BY MOUTH DAILY   metFORMIN 1000 MG tablet Commonly known as: GLUCOPHAGE TAKE 1 TABLET BY MOUTH  TWICE DAILY WITH MEALS   rosuvastatin 40 MG tablet Commonly known as: CRESTOR TAKE 1 TABLET BY MOUTH DAILY   venlafaxine XR 75 MG 24 hr capsule Commonly known as: EFFEXOR-XR Take 1 capsule (75 mg total) by mouth daily with breakfast.   zolpidem 5 MG tablet Commonly known as: AMBIEN Take 1 tablet (5 mg total) by mouth at bedtime as needed for sleep.  History (reviewed): Past Medical History:  Diagnosis Date   Diabetes mellitus    Hyperlipidemia    Hypertension    Past Surgical History:  Procedure Laterality Date   back surgery for a ruptured disc     Family History  Problem Relation Age of Onset   Hypertension Mother    Diabetes Brother    Cancer Maternal Grandfather        skin   Stomach cancer Sister    Heart disease Sister 50       stents.    Social History   Socioeconomic History   Marital status: Divorced    Spouse name: Not on file   Number of children: 1    Years of education: 13   Highest education level: Some college, no degree  Occupational History   Occupation: Airline pilot REPORTING [employer]    Employer: [employer]   Occupation: Retired.  Tobacco Use   Smoking status: Every Day    Current packs/day: 1.00    Types: Cigarettes   Smokeless tobacco: Never  Vaping Use   Vaping status: Never Used  Substance and Sexual Activity   Alcohol use: Yes   Drug use: No   Sexual activity: Not Currently  Other Topics Concern   Not on file  Social History Narrative   Lives with a friend. Her son lives in [place]. She enjoys watching sports, cooking, reading and yard work.   Social Determinants of Health   Financial Resource Strain: Low Risk  (10/19/2022)   Overall Financial Resource Strain (CARDIA)    Difficulty of Paying Living Expenses: Not hard at all  Food Insecurity: No Food Insecurity (10/19/2022)   Hunger Vital Sign    Worried About Running Out of Food in the Last Year: Never true    Ran Out of Food in the Last Year: Never true  Transportation Needs: No Transportation Needs (10/19/2022)   PRAPARE - Administrator, Civil Service (Medical): No    Lack of Transportation (Non-Medical): No  Physical Activity: Inactive (10/19/2022)   Exercise Vital Sign    Days of Exercise per Week: 0 days    Minutes of Exercise per Session: 0 min  Stress: No Stress Concern Present (10/19/2022)   [employer] of Occupational Health - Occupational Stress Questionnaire    Feeling of Stress : Not at all  Social Connections: Socially Isolated (10/19/2022)   Social Connection and Isolation Panel [NHANES]    Frequency of Communication with Friends and Family: Three times a week    Frequency of Social Gatherings with Friends and Family: Not on file    Attends Religious Services: Never    Active Member of Clubs or Organizations: No    Attends Banker Meetings: Never    Marital Status: Divorced    Activities of Daily Living     10/19/2022    2:05 PM  In your present state of health, do you have any difficulty performing the following activities:  Hearing? 0  Vision? 0  Difficulty concentrating or making decisions? 0  Walking or climbing stairs? 0  Dressing or bathing? 0  Doing errands, shopping? 0  Preparing Food and eating ? N  Using the Toilet? N  In the past six months, have you accidently leaked urine? N  Do you have problems with loss of bowel control? N  Managing your Medications? N  Managing your Finances? N  Housekeeping or managing your Housekeeping? N    Patient Education/ Literacy How  often do you need to have someone help you when you read instructions, pamphlets, or other written materials from your doctor or pharmacy?: 1 - Never What is the last grade level you completed in school?: 12th grade  Exercise    Diet Patient reports consuming 2 meals a day and 1-2 snack(s) a day Patient reports that her primary diet is: Regular Patient reports that she does have regular access to food.   Depression Screen    10/19/2022    2:02 PM 10/18/2022   10:28 AM 03/08/2022    3:03 PM 10/16/2021    1:05 PM 06/18/2021    8:18 AM 05/27/2020    9:09 AM 05/27/2020    9:07 AM  PHQ 2/9 Scores  PHQ - 2 Score 0 0 2 0 0 0 0  PHQ- 9 Score  2 3   0      Fall Risk    10/19/2022    2:02 PM 10/18/2022   10:28 AM 04/12/2022   10:50 AM 03/08/2022    2:59 PM 10/16/2021    1:05 PM  Fall Risk   Falls in the past year? 0 0 0 0 0  Number falls in past yr: 0 0 0 0 0  Injury with Fall? 0 0 0 0 0  Risk for fall due to : No Fall Risks No Fall Risks No Fall Risks No Fall Risks No Fall Risks  Follow up Falls evaluation completed Follow up appointment Falls evaluation completed Falls evaluation completed Falls evaluation completed     Objective:  Ottis Stain [facility] seemed alert and oriented and she participated appropriately during our telephone visit.  Blood Pressure Weight BMI  BP Readings from Last 3 Encounters:   10/18/22 115/67  04/12/22 121/64  03/08/22 133/82   Wt Readings from Last 3 Encounters:  10/18/22 134 lb (60.8 kg)  04/12/22 137 lb (62.1 kg)  03/08/22 138 lb (62.6 kg)   BMI Readings from Last 1 Encounters:  10/18/22 23.74 kg/m    *Unable to obtain current vital signs, weight, and BMI due to telephone visit type  Hearing/Vision  Audreena did not seem to have difficulty with hearing/understanding during the telephone conversation Reports that she has had a formal eye exam by an eye care professional within the past year Reports that she has not had a formal hearing evaluation within the past year *Unable to fully assess hearing and vision during telephone visit type  Cognitive Function:    10/19/2022    2:09 PM 10/16/2021    1:13 PM  6CIT Screen  What Year? 0 points 0 points  What month? 0 points 0 points  What time? 0 points 0 points  Count back from 20 0 points 0 points  Months in reverse 0 points 0 points  Repeat phrase 0 points 0 points  Total Score 0 points 0 points   (Normal:0-7, Significant for Dysfunction: >8)  Normal Cognitive Function Screening: Yes   Immunization & Health Maintenance Record Immunization History  Administered Date(s) Administered   Moderna SARS-COV2 Booster Vaccination 12/07/2019   Moderna Sars-Covid-2 Vaccination 03/03/2019, 03/31/2019   PNEUMOCOCCAL CONJUGATE-20 05/27/2020   Pneumococcal Polysaccharide-23 11/19/2014   Td 01/18/2006   Tdap 02/08/2017   Zoster Recombinant(Shingrix) 03/02/2021, 08/29/2021   Zoster, Live 09/12/2013    Health Maintenance  Topic Date Due   INFLUENZA VACCINE  04/18/2023 (Originally 08/19/2022)   COVID-19 Vaccine (3 - Moderna risk series) 04/28/2023 (Originally 01/04/2020)   HEMOGLOBIN A1C  04/17/2023   OPHTHALMOLOGY EXAM  09/18/2023   Diabetic kidney evaluation - eGFR measurement  10/18/2023   Diabetic kidney evaluation - Urine ACR  10/18/2023   FOOT EXAM  10/18/2023   Medicare Annual Wellness (AWV)   10/19/2023   MAMMOGRAM  10/29/2023   DEXA SCAN  10/29/2026   DTaP/Tdap/Td (3 - Td or Tdap) 02/09/2027   Colonoscopy  11/28/2029   Pneumonia Vaccine 42+ Years old  Completed   Hepatitis C Screening  Completed   Zoster Vaccines- Shingrix  Completed   HPV VACCINES  Aged Out       Assessment  This is a routine wellness examination for [employer].  Health Maintenance: Due or Overdue There are no preventive care reminders to display for this patient.   Ottis Stain Motsinger does not need a referral for Community Assistance: Care Management:   no Social Work:    no Prescription Assistance:  no Nutrition/Diabetes Education:  no   Plan:  Personalized Goals  Goals Addressed               This Visit's Progress     Patient Stated (pt-stated)        10/19/2022 AWV Goal: Tobacco Cessation  Smoking cessation instruction/counseling given:  counseled patient on the dangers of tobacco use, advised patient to stop smoking, and reviewed strategies to maximize success  Patient will verbalize understanding of the health risks associated with smoking/tobacco use Lung cancer or lung disease, such as COPD Heart disease. Stroke. Heart attack Infertility Osteoporosis and bone fractures. Patient will create a plan to quit smoking/using tobacco Pick a date to quit.  Write down the reasons why you are quitting and put it where you will see it often. Identify the people, places, things, and activities that make you want to smoke (triggers) and avoid them. Make sure to take these actions: Throw away all cigarettes at home, at work, and in your car. Throw away smoking accessories, such as Set designer. Clean your car and make sure to empty the ashtray. Clean your home, including curtains and carpets. Tell your family, friends, and coworkers that you are quitting. Support from your loved ones can make quitting easier. Talk with your health care provider about your options for quitting  smoking. Find out what treatment options are covered by your health insurance. Patient will be able to demonstrate knowledge of tobacco cessation strategies that may maximize success Quitting "cold [place]" is more successful than gradually quitting. Attending in-person counseling to help you build problem-solving skills.  Finding resources and support systems that can help you to quit smoking and remain smoke-free after you quit. These resources are most helpful when you use them often. They can include: Online chats with a Veterinary surgeon. Telephone quitlines. Printed Materials engineer. Support groups or group counseling. Text messaging programs. Mobile phone applications. Taking medicines to help you quit smoking: Nicotine patches, gum, or lozenges. Nicotine inhalers or sprays. Non-nicotine medicine that is taken by mouth. Patient will note get discouraged if the process is difficult Over the next year, patient will stop smoking or using other forms of tobacco  Smoking cessation instruction/counseling given:  counseled patient on the dangers of tobacco use, advised patient to stop smoking, and reviewed strategies to maximize success         Personalized Health Maintenance & Screening Recommendations  Screening mammography - scheduled.  Lung Cancer Screening Recommended: yes; already had one in August, 2024. (Low Dose CT Chest recommended if Age 38-80 years, 20 pack-year currently smoking OR have quit  w/in past 15 years) Hepatitis C Screening recommended: no HIV Screening recommended: no  Advanced Directives: Written information was not prepared per patient's request.  Referrals & Orders No orders of the defined types were placed in this encounter.   Follow-up Plan Follow-up with Agapito Games, MD as planned Medicare wellness visit in one year.  AVS printed and mailed to the patient.    I have personally reviewed and noted the following in the patient's chart:    Medical and social history Use of alcohol, tobacco or illicit drugs  Current medications and supplements Functional ability and status Nutritional status Physical activity Advanced directives List of other physicians Hospitalizations, surgeries, and ER visits in previous 12 months Vitals Screenings to include cognitive, depression, and falls Referrals and appointments  In addition, I have reviewed and discussed with Krista Love certain preventive protocols, quality metrics, and best practice recommendations. A written personalized care plan for preventive services as well as general preventive health recommendations is available and can be mailed to the patient at her request.      Modesto Charon, RN BSN  10/19/2022

## 2022-11-03 ENCOUNTER — Encounter: Payer: Self-pay | Admitting: Family Medicine

## 2022-11-10 ENCOUNTER — Other Ambulatory Visit: Payer: Self-pay | Admitting: Family Medicine

## 2022-11-10 DIAGNOSIS — G4700 Insomnia, unspecified: Secondary | ICD-10-CM

## 2022-11-11 MED ORDER — ZOLPIDEM TARTRATE 5 MG PO TABS
5.0000 mg | ORAL_TABLET | Freq: Every evening | ORAL | 0 refills | Status: DC | PRN
Start: 2022-11-11 — End: 2023-02-09

## 2022-11-11 NOTE — Telephone Encounter (Signed)
Last OV: 10/18/22 Next OV: 02/21/23 Last RF: 08/17/22

## 2022-11-17 DIAGNOSIS — Z1231 Encounter for screening mammogram for malignant neoplasm of breast: Secondary | ICD-10-CM

## 2022-12-10 ENCOUNTER — Other Ambulatory Visit: Payer: Self-pay | Admitting: Family Medicine

## 2022-12-10 DIAGNOSIS — Z1231 Encounter for screening mammogram for malignant neoplasm of breast: Secondary | ICD-10-CM

## 2023-02-08 ENCOUNTER — Encounter: Payer: Self-pay | Admitting: Family Medicine

## 2023-02-08 DIAGNOSIS — G4700 Insomnia, unspecified: Secondary | ICD-10-CM

## 2023-02-09 MED ORDER — ZOLPIDEM TARTRATE 5 MG PO TABS
5.0000 mg | ORAL_TABLET | Freq: Every evening | ORAL | 0 refills | Status: DC | PRN
Start: 2023-02-09 — End: 2023-05-09

## 2023-02-10 ENCOUNTER — Encounter: Payer: Self-pay | Admitting: Family Medicine

## 2023-02-16 ENCOUNTER — Ambulatory Visit: Payer: Medicare Other

## 2023-02-16 DIAGNOSIS — Z1231 Encounter for screening mammogram for malignant neoplasm of breast: Secondary | ICD-10-CM

## 2023-02-18 ENCOUNTER — Encounter: Payer: Self-pay | Admitting: Family Medicine

## 2023-02-18 NOTE — Progress Notes (Signed)
 Please call patient. Normal mammogram.  Repeat in 1 year.

## 2023-02-21 ENCOUNTER — Ambulatory Visit: Payer: Medicare Other | Admitting: Family Medicine

## 2023-03-01 ENCOUNTER — Encounter: Payer: Self-pay | Admitting: Family Medicine

## 2023-03-01 ENCOUNTER — Ambulatory Visit (INDEPENDENT_AMBULATORY_CARE_PROVIDER_SITE_OTHER): Payer: Medicare Other | Admitting: Family Medicine

## 2023-03-01 VITALS — BP 130/70 | HR 109 | Ht 63.0 in | Wt 133.0 lb

## 2023-03-01 DIAGNOSIS — J0110 Acute frontal sinusitis, unspecified: Secondary | ICD-10-CM | POA: Diagnosis not present

## 2023-03-01 DIAGNOSIS — I10 Essential (primary) hypertension: Secondary | ICD-10-CM

## 2023-03-01 DIAGNOSIS — E119 Type 2 diabetes mellitus without complications: Secondary | ICD-10-CM | POA: Diagnosis not present

## 2023-03-01 DIAGNOSIS — F341 Dysthymic disorder: Secondary | ICD-10-CM

## 2023-03-01 DIAGNOSIS — Z7984 Long term (current) use of oral hypoglycemic drugs: Secondary | ICD-10-CM | POA: Diagnosis not present

## 2023-03-01 DIAGNOSIS — F330 Major depressive disorder, recurrent, mild: Secondary | ICD-10-CM

## 2023-03-01 LAB — POCT GLYCOSYLATED HEMOGLOBIN (HGB A1C): Hemoglobin A1C: 6.1 % — AB (ref 4.0–5.6)

## 2023-03-01 MED ORDER — REXULTI 0.5 MG PO TABS
1.0000 | ORAL_TABLET | Freq: Every day | ORAL | 1 refills | Status: DC
Start: 2023-03-01 — End: 2023-06-14

## 2023-03-01 MED ORDER — VENLAFAXINE HCL ER 150 MG PO CP24: 150.0000 mg | ORAL_CAPSULE | Freq: Every day | ORAL | 1 refills | Status: DC

## 2023-03-01 MED ORDER — AMOXICILLIN 875 MG PO TABS: 875.0000 mg | ORAL_TABLET | Freq: Two times a day (BID) | ORAL | 0 refills | Status: DC

## 2023-03-01 NOTE — Assessment & Plan Note (Signed)
1C looks phenomenal today at 6.1.  Continue current regimen continue to work on healthy diet and regular exercise.

## 2023-03-01 NOTE — Telephone Encounter (Signed)
Attempted PA: Information regarding your request This medication or product is on your plan's list of covered drugs. Prior authorization is not required at this time. If your pharmacy has questions regarding the processing of your prescription, please have them call the [facility] pharmacy help desk at [phone]. **Please note: This request was submitted electronically. Formulary lowering, tiering exception, cost reduction and/or pre-benefit determination review (including prospective Medicare hospice reviews) requests cannot be requested using this method of submission. Providers contact us at [phone] for further assistance.

## 2023-03-01 NOTE — Progress Notes (Addendum)
Established Patient Office Visit  Subjective  Patient ID: UMI MAINOR, female    DOB: 07-23-1955  Age: 68 y.o. MRN: [id number]  Chief Complaint  Patient presents with   Diabetes   Hypertension    HPI Has some questions about diabtes today.   She feels like her depression is not well controlled.  Does feel like the venlafaxine helps some and we had bumped up her dose back in the summer.  But she just feels like it still just not really well-controlled like she puts on her face every day to be interact with other people but does not really feel happy on the inside.  She reports sinus symptoms for approximately 1 week. + cough.  No fever or chills. No ear pain.  Lost sof bilat facial sinus pressure . Taking Sudafed.  Hasn't tested for COVID.       ROS    Objective:     BP 130/70   Pulse (!) 109   Ht 5\' 3"  (1.6 m)   Wt 133 lb (60.3 kg)   SpO2 97%   BMI 23.56 kg/m    Physical Exam Constitutional:      Appearance: Normal appearance.  HENT:     Head: Normocephalic and atraumatic.     Right Ear: Tympanic membrane, ear canal and external ear normal. There is no impacted cerumen.     Left Ear: Tympanic membrane, ear canal and external ear normal. There is no impacted cerumen.     Nose: Nose normal.     Mouth/Throat:     Pharynx: Oropharynx is clear.  Eyes:     Conjunctiva/sclera: Conjunctivae normal.  Cardiovascular:     Rate and Rhythm: Normal rate and regular rhythm.  Pulmonary:     Effort: Pulmonary effort is normal.     Breath sounds: Normal breath sounds.  Musculoskeletal:     Cervical back: Neck supple. No tenderness.  Lymphadenopathy:     Cervical: No cervical adenopathy.  Skin:    General: Skin is warm and dry.  Neurological:     Mental Status: She is alert and oriented to person, place, and time.  Psychiatric:        Mood and Affect: Mood normal.    Results for orders placed or performed in visit on 03/01/23  POCT HgB A1C  Result Value Ref Range    Hemoglobin A1C 6.1 (A) 4.0 - 5.6 %   HbA1c POC (<> result, manual entry)     HbA1c, POC (prediabetic range)     HbA1c, POC (controlled diabetic range)    HM DIABETES EYE EXAM  Result Value Ref Range   HM Diabetic Eye Exam No Retinopathy No Retinopathy      The 10-year ASCVD risk score (Arnett DK, et al., 2019) is: 30.8%    Assessment & Plan:   Problem List Items Addressed This Visit       Cardiovascular and Mediastinum   HYPERTENSION, BENIGN - Primary   Well controlled. Continue current regimen. Follow up in  6 mo         Endocrine   Diabetes type 2, controlled (HCC)   1C looks phenomenal today at 6.1.  Continue current regimen continue to work on healthy diet and regular exercise.      Relevant Orders   POCT HgB A1C (Completed)     Other   MDD (major depressive disorder), recurrent episode (HCC)   Discussed options including bumping up the venlafaxine or doing add-on therapy.  She  would really like to try the Rexulti and see if it is helpful for her.  If not improving then also consider switching medications completely.  Follow-up in about 6 to 8 weeks after medication change.      Relevant Medications   Brexpiprazole (REXULTI) 0.5 MG TABS   Other Visit Diagnoses       Acute non-recurrent frontal sinusitis       Relevant Medications   amoxicillin (AMOXIL) 875 MG tablet       Cute sinusitis-she has had symptoms for about 8 days.  We did discuss maybe giving it another day or 2 but if she is not better okay to go ahead and fill the prescription for amoxicillin.  Return in about 6 weeks (around 04/12/2023) for Mood, New start medication.    Nani Gasser, MD

## 2023-03-01 NOTE — Assessment & Plan Note (Signed)
Discussed options including bumping up the venlafaxine or doing add-on therapy.  She would really like to try the Rexulti and see if it is helpful for her.  If not improving then also consider switching medications completely.  Follow-up in about 6 to 8 weeks after medication change.

## 2023-03-01 NOTE — Assessment & Plan Note (Signed)
Well controlled. Continue current regimen. Follow up in  6 mo

## 2023-04-05 IMAGING — CT CT CHEST LUNG CANCER SCREENING LOW DOSE W/O CM
1 of 2 series · 10 of 20 positions shown, 13 images · non-contrast
Comparison: 08/08/2019

CLINICAL DATA: Forty-eight pack-year smoking history. Current
smoker.

EXAM:
CT CHEST WITHOUT CONTRAST LOW-DOSE FOR LUNG CANCER SCREENING
TECHNIQUE: Multidetector CT imaging of the chest was performed following the
standard protocol without IV contrast.

[ct lung segmentation data · axial · 0.63mm/px · z∈[+1188,+1188]mm · 10 of 273 frames shown]
[frame 1/273  mediastinal]
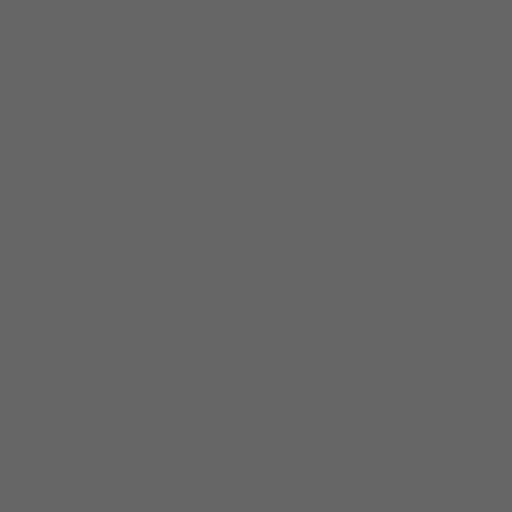
[frame 1/273  lung]
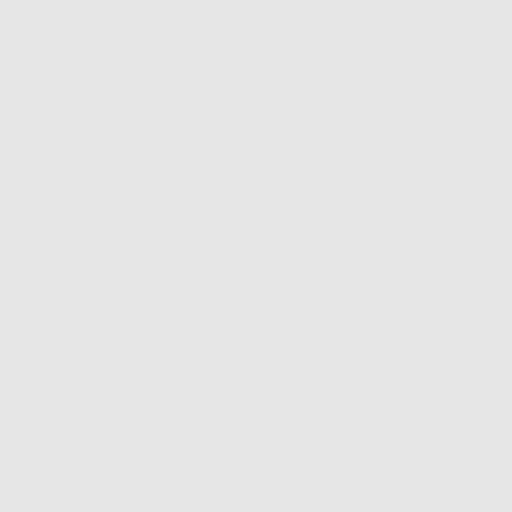
[frame 31/273  lung]
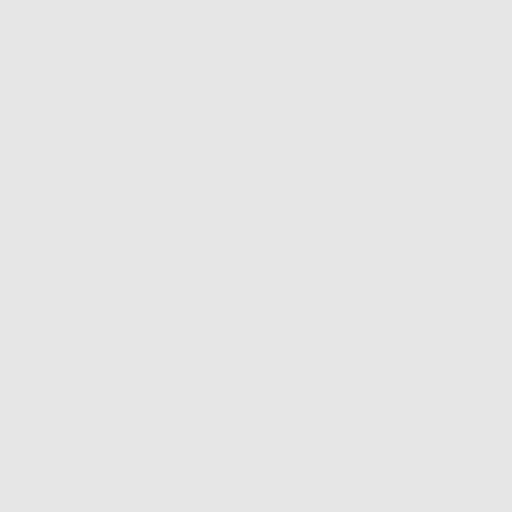
[frame 61/273  lung]
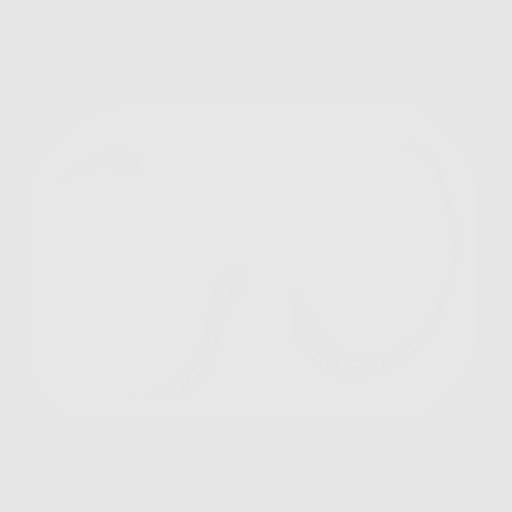
[frame 91/273  lung]
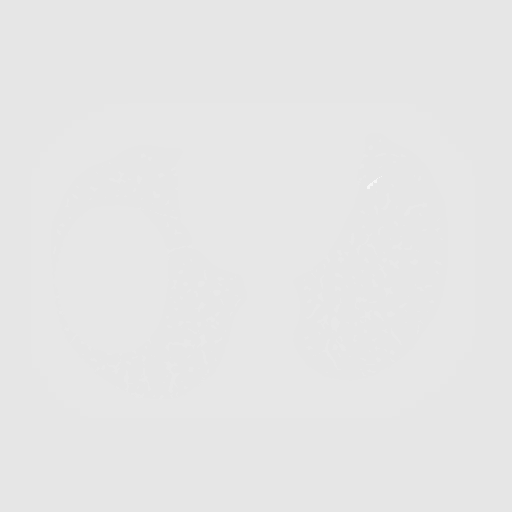
[frame 121/273  mediastinal]
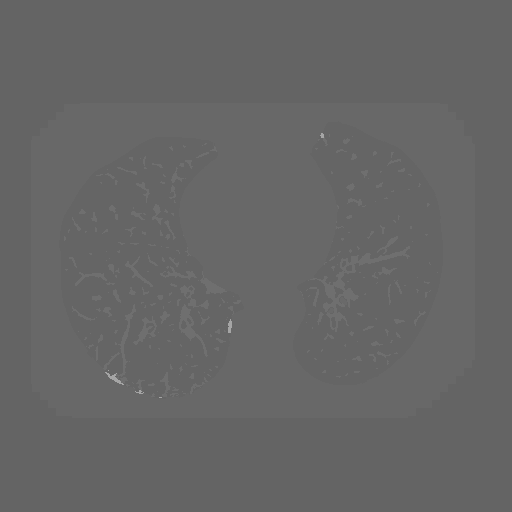
[frame 121/273  lung]
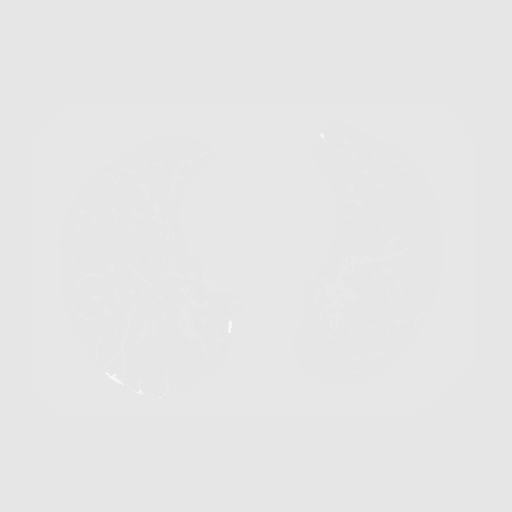
[frame 152/273  lung]
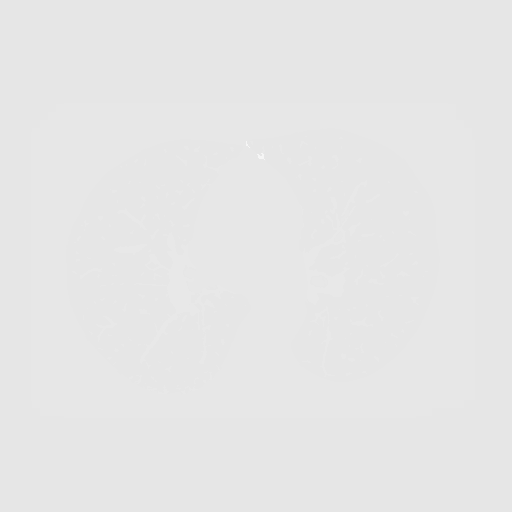
[frame 182/273  lung]
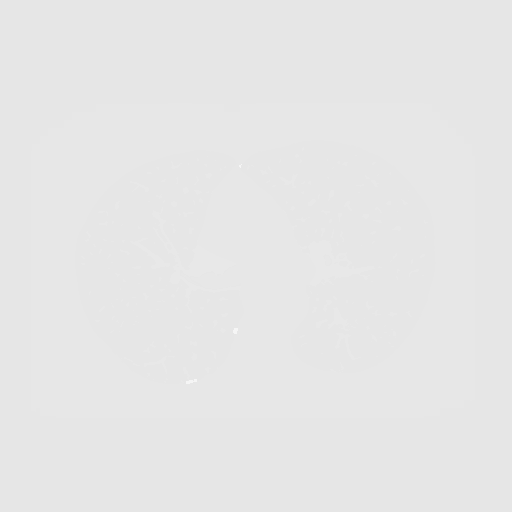
[frame 212/273  lung]
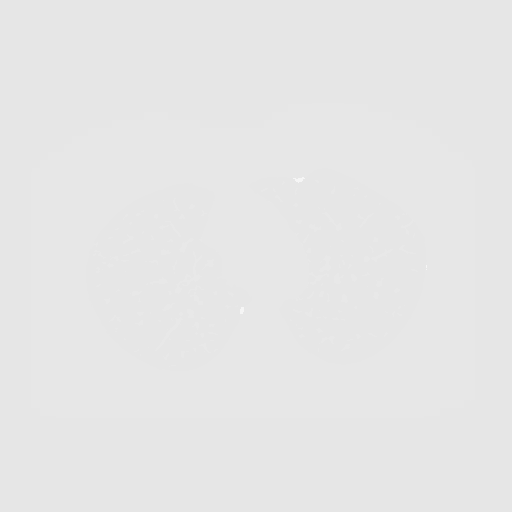
[frame 242/273  mediastinal]
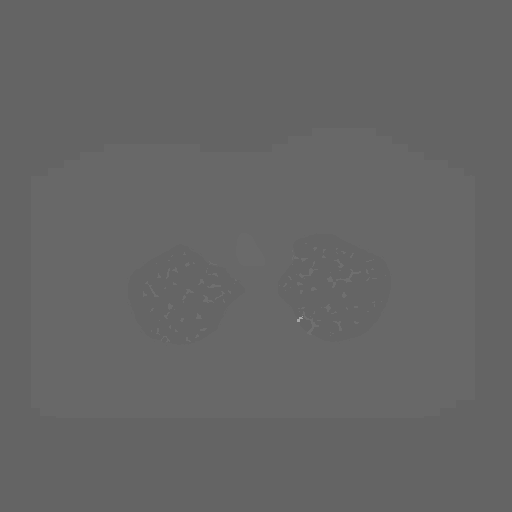
[frame 242/273  lung]
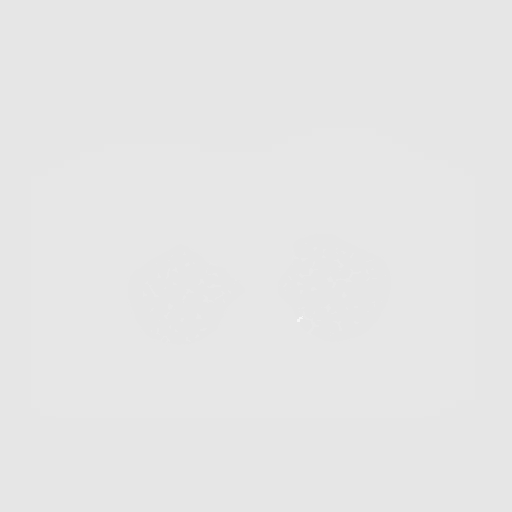
[frame 273/273  lung]
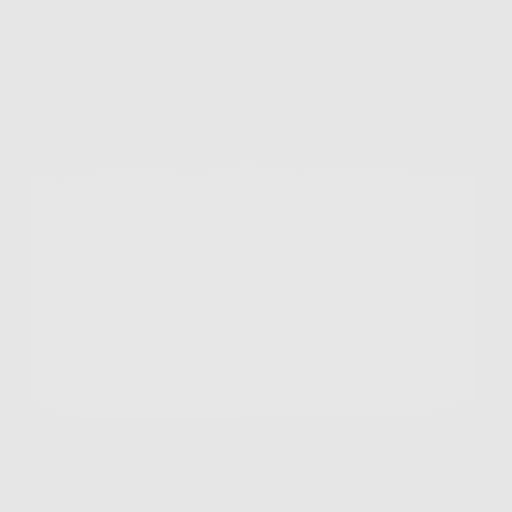

[10 of 20 positions shown; findings below may reference images not displayed]

FINDINGS: Cardiovascular: Aortic atherosclerosis. Normal heart size, without
pericardial effusion. Multivessel coronary artery atherosclerosis.

Mediastinum/Nodes: No mediastinal or definite hilar adenopathy,
given limitations of unenhanced CT.

Lungs/Pleura: No pleural fluid. Mild centrilobular emphysema. An
area of ground-glass in the right upper lobe is similar at volume
derived equivalent diameter 18.4 mm. Left upper lobe calcified
granuloma.

Upper Abdomen: Normal imaged portions of the liver, spleen, stomach,
adrenal glands, left kidney.

Musculoskeletal: No acute osseous abnormality.
IMPRESSION: 1. Lung-RADS 2, benign appearance or behavior. Continue annual
screening with low-dose chest CT without contrast in 12 months.
2. Aortic Atherosclerosis (L8R2S-A4H.H) and Emphysema (L8R2S-RJY.3).

## 2023-04-11 ENCOUNTER — Encounter: Payer: Self-pay | Admitting: Family Medicine

## 2023-04-12 ENCOUNTER — Ambulatory Visit: Payer: Medicare Other | Admitting: Family Medicine

## 2023-04-14 ENCOUNTER — Other Ambulatory Visit: Payer: Self-pay | Admitting: Family Medicine

## 2023-04-14 DIAGNOSIS — E785 Hyperlipidemia, unspecified: Secondary | ICD-10-CM

## 2023-04-14 DIAGNOSIS — I10 Essential (primary) hypertension: Secondary | ICD-10-CM

## 2023-04-26 ENCOUNTER — Other Ambulatory Visit: Payer: Self-pay

## 2023-04-26 ENCOUNTER — Encounter: Payer: Self-pay | Admitting: Family Medicine

## 2023-04-26 MED ORDER — VENLAFAXINE HCL ER 150 MG PO CP24: 150.0000 mg | ORAL_CAPSULE | Freq: Every day | ORAL | 1 refills | Status: DC

## 2023-05-05 NOTE — Progress Notes (Signed)
 [facility] Quality Team Note  Name: CINDI GHAZARIAN Date of Birth: May 21, 1955 MRN: [id number] Date: 05/05/2023  [facility] Quality Team has reviewed this patient's chart, please see recommendations below:  [facility] Quality Other; (Patient needs appt for microalbumin/creatinine ratio and eGFR completion. I have faxed [facility] for most recent eye exam)

## 2023-05-09 ENCOUNTER — Encounter: Payer: Self-pay | Admitting: Family Medicine

## 2023-05-09 DIAGNOSIS — G4700 Insomnia, unspecified: Secondary | ICD-10-CM

## 2023-05-09 MED ORDER — ZOLPIDEM TARTRATE 5 MG PO TABS: 5.0000 mg | ORAL_TABLET | Freq: Every evening | ORAL | 0 refills | Status: DC | PRN

## 2023-05-12 NOTE — Progress Notes (Signed)
 Patient scheduled.

## 2023-06-01 LAB — HM DIABETES EYE EXAM

## 2023-06-14 ENCOUNTER — Encounter: Payer: Self-pay | Admitting: Family Medicine

## 2023-06-14 ENCOUNTER — Ambulatory Visit (INDEPENDENT_AMBULATORY_CARE_PROVIDER_SITE_OTHER): Admitting: Family Medicine

## 2023-06-14 VITALS — BP 122/65 | HR 98 | Ht 63.0 in | Wt 136.2 lb

## 2023-06-14 DIAGNOSIS — I10 Essential (primary) hypertension: Secondary | ICD-10-CM | POA: Diagnosis not present

## 2023-06-14 DIAGNOSIS — G4700 Insomnia, unspecified: Secondary | ICD-10-CM | POA: Diagnosis not present

## 2023-06-14 DIAGNOSIS — Z720 Tobacco use: Secondary | ICD-10-CM | POA: Diagnosis not present

## 2023-06-14 DIAGNOSIS — E119 Type 2 diabetes mellitus without complications: Secondary | ICD-10-CM

## 2023-06-14 DIAGNOSIS — Z7984 Long term (current) use of oral hypoglycemic drugs: Secondary | ICD-10-CM

## 2023-06-14 DIAGNOSIS — F330 Major depressive disorder, recurrent, mild: Secondary | ICD-10-CM | POA: Diagnosis not present

## 2023-06-14 LAB — POCT GLYCOSYLATED HEMOGLOBIN (HGB A1C): Hemoglobin A1C: 5.7 % — AB (ref 4.0–5.6)

## 2023-06-14 MED ORDER — FLUTICASONE PROPIONATE 50 MCG/ACT NA SUSP: 1.0000 | Freq: Every day | NASAL | 6 refills | Status: DC

## 2023-06-14 MED ORDER — FLUTICASONE PROPIONATE 50 MCG/ACT NA SUSP: 1.0000 | Freq: Every day | NASAL | 2 refills | Status: DC

## 2023-06-14 MED ORDER — VARENICLINE TARTRATE 1 MG PO TABS
ORAL_TABLET | ORAL | 1 refills | Status: AC
Start: 2023-06-14 — End: 2023-09-19

## 2023-06-14 MED ORDER — PREDNISONE 20 MG PO TABS: 40.0000 mg | ORAL_TABLET | Freq: Every day | ORAL | 0 refills | Status: DC

## 2023-06-14 NOTE — Assessment & Plan Note (Signed)
 Initial BP high will recheck today before sh eleaves.

## 2023-06-14 NOTE — Progress Notes (Signed)
 Pt would like to get refill of medication to stop smoking

## 2023-06-14 NOTE — Telephone Encounter (Signed)
 Spoke with pt and she wasn't sure what 2 medications they were referring to. I did let her know about the recent chantix  script and she said she had a print out of that one because she wanted to pick it up when she was ready.

## 2023-06-14 NOTE — Assessment & Plan Note (Signed)
 A1C looks awesome today.    Lab Results  Component Value Date   HGBA1C 5.7 (A) 06/14/2023

## 2023-06-14 NOTE — Assessment & Plan Note (Signed)
 Doing well on Ambien , happy with current regimen

## 2023-06-14 NOTE — Assessment & Plan Note (Signed)
 She feels like the bump on the venlafaxine  has been really helpful  PHQ9 score of 1 and GAD 7 score of 1.

## 2023-06-14 NOTE — Progress Notes (Signed)
   Established Patient Office Visit  Subjective  Patient ID: Krista Love, female    DOB: [birth year]  Age: 68 y.o. MRN: [id number]  Chief Complaint  Patient presents with   Hypertension   Diabetes   Nicotine Dependence    HPI  Hypertension- Pt denies chest pain, SOB, dizziness, or heart palpitations.  Taking meds as directed w/o problems.  Denies medication side effects.    Diabetes - no hypoglycemic events. No wounds or sores that are not healing well. No increased thirst or urination. Checking glucose at home. Taking medications as prescribed without any side effects.  Would like to try Chantix  again.      ROS    Objective:      BP 122/65   Pulse 98   Ht 5\' 3"  (1.6 m)   Wt 136 lb 3.2 oz (61.8 kg)   SpO2 99%   BMI 24.13 kg/m    Physical Exam Vitals and nursing note reviewed.  Constitutional:      Appearance: Normal appearance.  HENT:     Head: Normocephalic and atraumatic.  Eyes:     Conjunctiva/sclera: Conjunctivae normal.  Cardiovascular:     Rate and Rhythm: Normal rate and regular rhythm.  Pulmonary:     Effort: Pulmonary effort is normal.     Comments: Coarse BS bilateral Skin:    General: Skin is warm and dry.  Neurological:     Mental Status: She is alert.  Psychiatric:        Mood and Affect: Mood normal.      Results for orders placed or performed in visit on 06/14/23  POCT HgB A1C  Result Value Ref Range   Hemoglobin A1C 5.7 (A) 4.0 - 5.6 %   HbA1c POC (<> result, manual entry)     HbA1c, POC (prediabetic range)     HbA1c, POC (controlled diabetic range)        The 10-year ASCVD risk score (Arnett DK, et al., 2019) is: 30.1%    Assessment & Plan:   Problem List Items Addressed This Visit       Cardiovascular and Mediastinum   HYPERTENSION, BENIGN   Initial BP high will recheck today before sh eleaves.       Relevant Orders   Urine Microalbumin w/creat. ratio   CMP14+EGFR   Lipid panel   CBC     Endocrine   Diabetes  type 2, controlled (HCC) - Primary   A1C looks awesome today.    Lab Results  Component Value Date   HGBA1C 5.7 (A) 06/14/2023         Relevant Orders   POCT HgB A1C (Completed)   Urine Microalbumin w/creat. ratio   CMP14+EGFR   Lipid panel   CBC     Other   Tobacco abuse   She would like to try Chantix  again. Had some head shaking with it.        Relevant Medications   varenicline  (CHANTIX ) 1 MG tablet   MDD (major depressive disorder), recurrent episode (HCC)   She feels like the bump on the venlafaxine  has been really helpful  PHQ9 score of 1 and GAD 7 score of 1.       Insomnia   Doing well on Ambien , happy with current regimen       Return in about 4 months (around 10/15/2023) for Hypertension, Diabetes follow-up.    Duaine German, MD

## 2023-06-14 NOTE — Assessment & Plan Note (Signed)
 She would like to try Chantix  again. Had some head shaking with it.

## 2023-06-15 ENCOUNTER — Encounter: Payer: Self-pay | Admitting: Family Medicine

## 2023-06-15 LAB — CMP14+EGFR
ALT: 13 IU/L (ref 0–32)
AST: 16 IU/L (ref 0–40)
Albumin: 4.4 g/dL (ref 3.9–4.9)
Alkaline Phosphatase: 96 IU/L (ref 44–121)
BUN/Creatinine Ratio: 11 — ABNORMAL LOW (ref 12–28)
BUN: 9 mg/dL (ref 8–27)
Bilirubin Total: <0.2 mg/dL (ref 0.0–1.2)
CO2: 22 mmol/L (ref 20–29)
Calcium: 9.8 mg/dL (ref 8.7–10.3)
Chloride: 98 mmol/L (ref 96–106)
Creatinine, Ser: 0.79 mg/dL (ref 0.57–1.00)
Globulin, Total: 2.2 g/dL (ref 1.5–4.5)
Glucose: 138 mg/dL — ABNORMAL HIGH (ref 70–99)
Potassium: 4.5 mmol/L (ref 3.5–5.2)
Sodium: 139 mmol/L (ref 134–144)
Total Protein: 6.6 g/dL (ref 6.0–8.5)
eGFR: 80 mL/min/{1.73_m2} (ref >59)

## 2023-06-15 LAB — LIPID PANEL
Chol/HDL Ratio: 2.8 ratio (ref 0.0–4.4)
Cholesterol, Total: 195 mg/dL (ref 100–199)
HDL: 70 mg/dL (ref >39)
LDL Chol Calc (NIH): 104 mg/dL — ABNORMAL HIGH (ref 0–99)
Triglycerides: 119 mg/dL (ref 0–149)
VLDL Cholesterol Cal: 21 mg/dL (ref 5–40)

## 2023-06-15 LAB — CBC
Hematocrit: 41.5 % (ref 34.0–46.6)
Hemoglobin: 13.6 g/dL (ref 11.1–15.9)
MCH: 31.1 pg (ref 26.6–33.0)
MCHC: 32.8 g/dL (ref 31.5–35.7)
MCV: 95 fL (ref 79–97)
Platelets: 285 10*3/uL (ref 150–450)
RBC: 4.38 x10E6/uL (ref 3.77–5.28)
RDW: 12.8 % (ref 11.7–15.4)
WBC: 6.5 10*3/uL (ref 3.4–10.8)

## 2023-06-15 LAB — MICROALBUMIN / CREATININE URINE RATIO
Creatinine, Urine: 29.5 mg/dL
Microalb/Creat Ratio: 42 mg/g{creat} — ABNORMAL HIGH (ref 0–29)
Microalbumin, Urine: 12.3 ug/mL

## 2023-06-16 ENCOUNTER — Ambulatory Visit: Payer: Self-pay | Admitting: Family Medicine

## 2023-06-16 NOTE — Progress Notes (Signed)
 Hi Krista Love, your metabolic panel overall looks good.  LDL is just a little elevated on your cholesterol.  Just make sure you are taking the Crestor  regularly and if you need any additional refills let us  know.  And also continue to work on healthy diet and regular exercise.  Blood count is normal.  You do have a little bit of protein spilling into the urine.  You are on lisinopril  which helps protect the kidneys so just important we continue to control blood pressure and glucose levels.

## 2023-07-04 ENCOUNTER — Encounter: Payer: Self-pay | Admitting: Family Medicine

## 2023-07-04 MED ORDER — VENLAFAXINE HCL ER 150 MG PO CP24: 150.0000 mg | ORAL_CAPSULE | Freq: Every day | ORAL | 0 refills | Status: DC

## 2023-07-14 ENCOUNTER — Other Ambulatory Visit: Payer: Self-pay

## 2023-07-14 ENCOUNTER — Encounter: Payer: Self-pay | Admitting: Family Medicine

## 2023-07-14 MED ORDER — VENLAFAXINE HCL ER 150 MG PO CP24: 150.0000 mg | ORAL_CAPSULE | Freq: Every day | ORAL | 0 refills | Status: DC

## 2023-07-15 NOTE — Telephone Encounter (Signed)
 I am not sure what happened if this again got entered and sent and then realized it was wrong and maybe we did not cancel it in time.

## 2023-08-08 ENCOUNTER — Encounter: Payer: Self-pay | Admitting: Family Medicine

## 2023-08-14 ENCOUNTER — Encounter: Payer: Self-pay | Admitting: Family Medicine

## 2023-08-14 DIAGNOSIS — I10 Essential (primary) hypertension: Secondary | ICD-10-CM

## 2023-08-15 ENCOUNTER — Telehealth: Payer: Self-pay | Admitting: Acute Care

## 2023-08-15 NOTE — Telephone Encounter (Signed)
 Left VM to call for scheduling annual LDCT.  [phone] to schedule

## 2023-08-16 MED ORDER — LISINOPRIL 40 MG PO TABS: 40.0000 mg | ORAL_TABLET | Freq: Every day | ORAL | 1 refills | Status: DC

## 2023-08-23 ENCOUNTER — Encounter: Payer: Self-pay | Admitting: Acute Care

## 2023-08-24 ENCOUNTER — Encounter: Payer: Self-pay | Admitting: Family Medicine

## 2023-08-24 DIAGNOSIS — G4700 Insomnia, unspecified: Secondary | ICD-10-CM

## 2023-08-25 MED ORDER — ZOLPIDEM TARTRATE 5 MG PO TABS: 5.0000 mg | ORAL_TABLET | Freq: Every evening | ORAL | 0 refills | Status: DC | PRN

## 2023-08-25 NOTE — Telephone Encounter (Signed)
 Patient requesting rx rf of Ambien  5mg   Last written 05/09/2023 Last OV 06/14/2023 Upcoming appt 10/17/2023

## 2023-08-26 ENCOUNTER — Ambulatory Visit (INDEPENDENT_AMBULATORY_CARE_PROVIDER_SITE_OTHER)

## 2023-08-26 DIAGNOSIS — F17210 Nicotine dependence, cigarettes, uncomplicated: Secondary | ICD-10-CM | POA: Diagnosis not present

## 2023-08-26 DIAGNOSIS — Z87891 Personal history of nicotine dependence: Secondary | ICD-10-CM

## 2023-08-26 DIAGNOSIS — Z122 Encounter for screening for malignant neoplasm of respiratory organs: Secondary | ICD-10-CM

## 2023-09-09 ENCOUNTER — Telehealth: Payer: Self-pay

## 2023-09-09 ENCOUNTER — Other Ambulatory Visit: Payer: Self-pay

## 2023-09-09 DIAGNOSIS — Z122 Encounter for screening for malignant neoplasm of respiratory organs: Secondary | ICD-10-CM

## 2023-09-09 DIAGNOSIS — R911 Solitary pulmonary nodule: Secondary | ICD-10-CM

## 2023-09-09 DIAGNOSIS — F17210 Nicotine dependence, cigarettes, uncomplicated: Secondary | ICD-10-CM

## 2023-09-09 DIAGNOSIS — Z87891 Personal history of nicotine dependence: Secondary | ICD-10-CM

## 2023-09-09 NOTE — Telephone Encounter (Signed)
 Spoke with patient and reviewed recent Lung CT results. She is in agreement to complete a 6 month follow up scan to evaluate a new 5.9 mm nodule. She is aware she will get a call back closer to 02/27/2024 to schedule scan per patient request. Order and reminder placed. She is also on statin therapy. Results and plan to PCP. NFN.

## 2023-09-09 NOTE — Telephone Encounter (Signed)
 LVM to review results.   IMPRESSION: 1. Lung-RADS 3, probably benign findings. Short-term follow-up in 6 months is recommended with repeat low-dose chest CT without contrast (please use the following order, CT CHEST LCS NODULE FOLLOW-UP W/O CM). New indistinct 5.9 mm anterior right upper lobe nodule. 2. Two-vessel coronary atherosclerosis. 3. Aortic Atherosclerosis (ICD10-I70.0) and Emphysema (ICD10-J43.9).

## 2023-10-17 ENCOUNTER — Ambulatory Visit: Admitting: Family Medicine

## 2023-10-17 ENCOUNTER — Encounter: Payer: Self-pay | Admitting: Family Medicine

## 2023-10-17 VITALS — BP 124/60 | HR 79 | Ht 63.0 in | Wt 134.1 lb

## 2023-10-17 DIAGNOSIS — I700 Atherosclerosis of aorta: Secondary | ICD-10-CM

## 2023-10-17 DIAGNOSIS — E1129 Type 2 diabetes mellitus with other diabetic kidney complication: Secondary | ICD-10-CM | POA: Diagnosis not present

## 2023-10-17 DIAGNOSIS — I10 Essential (primary) hypertension: Secondary | ICD-10-CM

## 2023-10-17 DIAGNOSIS — R911 Solitary pulmonary nodule: Secondary | ICD-10-CM | POA: Diagnosis not present

## 2023-10-17 DIAGNOSIS — F330 Major depressive disorder, recurrent, mild: Secondary | ICD-10-CM

## 2023-10-17 DIAGNOSIS — K5900 Constipation, unspecified: Secondary | ICD-10-CM

## 2023-10-17 DIAGNOSIS — J439 Emphysema, unspecified: Secondary | ICD-10-CM

## 2023-10-17 DIAGNOSIS — R809 Proteinuria, unspecified: Secondary | ICD-10-CM

## 2023-10-17 DIAGNOSIS — Z23 Encounter for immunization: Secondary | ICD-10-CM

## 2023-10-17 DIAGNOSIS — R7301 Impaired fasting glucose: Secondary | ICD-10-CM

## 2023-10-17 DIAGNOSIS — E119 Type 2 diabetes mellitus without complications: Secondary | ICD-10-CM

## 2023-10-17 LAB — POCT GLYCOSYLATED HEMOGLOBIN (HGB A1C): Hemoglobin A1C: 6.2 % — AB (ref 4.0–5.6)

## 2023-10-17 MED ORDER — LOSARTAN POTASSIUM 50 MG PO TABS: 50.0000 mg | ORAL_TABLET | Freq: Every day | ORAL | 0 refills | Status: DC

## 2023-10-17 NOTE — Progress Notes (Signed)
 "  Established Patient Office Visit  Subjective  Patient ID: Krista Love, female    DOB: [birth year]  Age: 68 y.o. MRN: [id number]  Chief Complaint  Patient presents with   Hypertension   Diabetes    HPI Discussed the use of AI scribe software for clinical note transcription with the patient, who gave verbal consent to proceed.  History of Present Illness Krista Love is a 68 year old female with hypertension who presents for blood pressure management.   Constipation - Constipation present since increase in venlafaxine  dose - Required Dulcolax twice in the past few months - Considering use of fiber supplements or stool softeners  Pulmonary nodule and thoracic imaging findings - Chest CT on August 8th revealed a new 5.9 mm nodule in the right upper lobe - Follow-up imaging recommended in six months - CT also showed aortic atherosclerosis and emphysema     ROS    Objective:     BP 124/60   Pulse 79   Ht 5' 3 (1.6 m)   Wt 134 lb 1.6 oz (60.8 kg)   SpO2 99%   BMI 23.75 kg/m    Physical Exam Vitals and nursing note reviewed.  Constitutional:      Appearance: Normal appearance.  HENT:     Head: Normocephalic and atraumatic.  Eyes:     Conjunctiva/sclera: Conjunctivae normal.  Cardiovascular:     Rate and Rhythm: Normal rate and regular rhythm.  Pulmonary:     Effort: Pulmonary effort is normal.     Breath sounds: Normal breath sounds.  Skin:    General: Skin is warm and dry.  Neurological:     Mental Status: She is alert.  Psychiatric:        Mood and Affect: Mood normal.      Results for orders placed or performed in visit on 10/17/23  POCT HgB A1C  Result Value Ref Range   Hemoglobin A1C 6.2 (A) 4.0 - 5.6 %   HbA1c POC (<> result, manual entry)     HbA1c, POC (prediabetic range)     HbA1c, POC (controlled diabetic range)        The 10-year ASCVD risk score (Arnett DK, et al., 2019) is: 31.7%    Assessment & Plan:   Problem  List Items Addressed This Visit       Cardiovascular and Mediastinum   HYPERTENSION, BENIGN   Hypertension and orthostatic symptoms - Monitors blood pressure daily since May - Initial readings around 130/80 mmHg on lower dose antihypertensive - Current antihypertensive dose is lisinopril  40 mg, increased from 20 mg - Blood pressure often below 120/80 mmHg, with occasional low readings such as 104/60 mmHg - Experiences off-balance sensation when standing, managed by drinking orange juice - Concerned about overmedication if diastolic pressure drops below 60 mmHg - Blood pressure reading of 137/80 mmHg this morning, attributed to stress from recent flu vaccination      Relevant Medications   losartan  (COZAAR ) 50 MG tablet   Other Relevant Orders   CMP14+EGFR   Aortic atherosclerosis   Noted on repeat CT. Contineu Crestor        Relevant Medications   losartan  (COZAAR ) 50 MG tablet     Respiratory   Pulmonary nodule   Emphysema lung (HCC)   Currently asymptomatic she does not currently have an albuterol  inhaler on hand so I will send 1 over to the pharmacy in case she gets any respiratory symptoms this winter.  Relevant Medications   albuterol  (VENTOLIN  HFA) 108 (90 Base) MCG/ACT inhaler     Endocrine   Microalbuminuria due to type 2 diabetes mellitus (HCC)   Relevant Medications   losartan  (COZAAR ) 50 MG tablet   Other Relevant Orders   POCT HgB A1C (Completed)   IFG (impaired fasting glucose)   - Hemoglobin A1c increased to 6.2% from 5.7% previously - Takes metformin , usually once daily - Typically consumes one large meal per day      RESOLVED: Diabetes type 2, controlled (HCC) - Primary   Relevant Medications   losartan  (COZAAR ) 50 MG tablet   Other Relevant Orders   CMP14+EGFR     Other   MDD (major depressive disorder), recurrent episode   Mood and emotional well-being - Feels emotionally well, attributed to increased dose of venlafaxine       Other  Visit Diagnoses       Encounter for immunization       Relevant Orders   Flu vaccine HIGH DOSE PF(Fluzone Trivalent) (Completed)     Constipation, unspecified constipation type          Assessment and Plan Assessment & Plan Essential hypertension Blood pressure improved but fluctuates between 110-130 mmHg. Some readings below 110 mmHg suggest possible overmedication. Diastolic pressures in the 60s are acceptable. Symptoms of lightheadedness may relate to low blood pressure or blood sugar. Considering switching to losartan  for better control. - Switch lisinopril  to losartan  for 30 days to assess blood pressure control. - Check blood pressure and blood sugar levels if symptoms of lightheadedness occur. - Send prescription for losartan  to [facility].  Type 2 diabetes mellitus A1c increased from 5.7% to 6.2%. Current metformin  use is inconsistent due to meal patterns. Emphasized importance of adherence to manage glucose levels. - Encourage adherence to metformin  regimen as prescribed. - Monitor blood glucose levels regularly.  Constipation Recent constipation episodes possibly due to venlafaxine  or age-related changes. Dulcolax and magnesium citrate effective. Consider preventive measures for regularity. - Use Dulcolax if no bowel movement for 2-3 days. - Consider daily fiber supplement or stool softener. - Mix a small amount of Miralax in daily coffee to maintain regularity.   Return in about 4 months (around 02/16/2024) for Diabetes follow-up.    Krista Byars, MD "

## 2023-10-17 NOTE — Assessment & Plan Note (Signed)
 Noted on repeat CT. Contineu Crestor 

## 2023-10-17 NOTE — Patient Instructions (Signed)
 Try losartan in place of the lisinopril   monitor BPs athome and see if working better.

## 2023-10-18 ENCOUNTER — Encounter: Payer: Self-pay | Admitting: Family Medicine

## 2023-10-18 DIAGNOSIS — R911 Solitary pulmonary nodule: Secondary | ICD-10-CM | POA: Insufficient documentation

## 2023-10-18 DIAGNOSIS — R7301 Impaired fasting glucose: Secondary | ICD-10-CM | POA: Insufficient documentation

## 2023-10-18 MED ORDER — ALBUTEROL SULFATE HFA 108 (90 BASE) MCG/ACT IN AERS: 2.0000 | INHALATION_SPRAY | Freq: Four times a day (QID) | RESPIRATORY_TRACT | 0 refills | Status: DC | PRN

## 2023-10-18 NOTE — Assessment & Plan Note (Signed)
 Currently asymptomatic she does not currently have an albuterol inhaler on hand so I will send 1 over to the pharmacy in case she gets any respiratory symptoms this winter.

## 2023-10-18 NOTE — Assessment & Plan Note (Signed)
 Hypertension and orthostatic symptoms - Monitors blood pressure daily since May - Initial readings around 130/80 mmHg on lower dose antihypertensive - Current antihypertensive dose is lisinopril  40 mg, increased from 20 mg - Blood pressure often below 120/80 mmHg, with occasional low readings such as 104/60 mmHg - Experiences off-balance sensation when standing, managed by drinking orange juice - Concerned about overmedication if diastolic pressure drops below 60 mmHg - Blood pressure reading of 137/80 mmHg this morning, attributed to stress from recent flu vaccination

## 2023-10-18 NOTE — Assessment & Plan Note (Signed)
-   Hemoglobin A1c increased to 6.2% from 5.7% previously - Takes metformin , usually once daily - Typically consumes one large meal per day

## 2023-10-18 NOTE — Assessment & Plan Note (Signed)
 Mood and emotional well-being - Feels emotionally well, attributed to increased dose of venlafaxine 

## 2023-10-21 ENCOUNTER — Other Ambulatory Visit: Payer: Self-pay | Admitting: *Deleted

## 2023-10-21 DIAGNOSIS — I10 Essential (primary) hypertension: Secondary | ICD-10-CM

## 2023-10-21 NOTE — Progress Notes (Signed)
 Error

## 2023-11-02 ENCOUNTER — Encounter: Payer: Self-pay | Admitting: Family Medicine

## 2023-11-02 DIAGNOSIS — I10 Essential (primary) hypertension: Secondary | ICD-10-CM

## 2023-11-02 MED ORDER — LOSARTAN POTASSIUM 50 MG PO TABS: 50.0000 mg | ORAL_TABLET | Freq: Every day | ORAL | 0 refills | Status: DC

## 2023-11-02 NOTE — Addendum Note (Signed)
 Addended by: ONEITA JOSETTE CROME on: 11/02/2023 03:33 PM   Modules accepted: Orders

## 2023-11-10 ENCOUNTER — Other Ambulatory Visit: Payer: Self-pay | Admitting: Family Medicine

## 2023-11-30 ENCOUNTER — Encounter: Payer: Self-pay | Admitting: Family Medicine

## 2023-11-30 DIAGNOSIS — G4700 Insomnia, unspecified: Secondary | ICD-10-CM

## 2023-12-01 MED ORDER — ZOLPIDEM TARTRATE 5 MG PO TABS: 5.0000 mg | ORAL_TABLET | Freq: Every evening | ORAL | 0 refills | Status: AC | PRN

## 2023-12-01 NOTE — Telephone Encounter (Signed)
 Requesting rx rf of Ambien  5mg   Last written 08/07/225 Last OV 10/17/2023 Upcoming appt 01/29/226

## 2023-12-03 ENCOUNTER — Other Ambulatory Visit: Payer: Self-pay | Admitting: Family Medicine

## 2023-12-03 DIAGNOSIS — E785 Hyperlipidemia, unspecified: Secondary | ICD-10-CM

## 2023-12-03 DIAGNOSIS — I10 Essential (primary) hypertension: Secondary | ICD-10-CM

## 2024-01-31 ENCOUNTER — Telehealth: Payer: Self-pay

## 2024-01-31 NOTE — Telephone Encounter (Signed)
 LVM to scheduled follow up scan.

## 2024-02-16 ENCOUNTER — Ambulatory Visit (INDEPENDENT_AMBULATORY_CARE_PROVIDER_SITE_OTHER): Admitting: Family Medicine

## 2024-02-16 ENCOUNTER — Ambulatory Visit (INDEPENDENT_AMBULATORY_CARE_PROVIDER_SITE_OTHER)

## 2024-02-16 ENCOUNTER — Encounter: Payer: Self-pay | Admitting: Family Medicine

## 2024-02-16 VITALS — BP 125/71 | HR 95 | Ht 63.0 in | Wt 133.0 lb

## 2024-02-16 DIAGNOSIS — J439 Emphysema, unspecified: Secondary | ICD-10-CM

## 2024-02-16 DIAGNOSIS — F331 Major depressive disorder, recurrent, moderate: Secondary | ICD-10-CM | POA: Diagnosis not present

## 2024-02-16 DIAGNOSIS — E1129 Type 2 diabetes mellitus with other diabetic kidney complication: Secondary | ICD-10-CM | POA: Diagnosis not present

## 2024-02-16 DIAGNOSIS — E119 Type 2 diabetes mellitus without complications: Secondary | ICD-10-CM

## 2024-02-16 DIAGNOSIS — I10 Essential (primary) hypertension: Secondary | ICD-10-CM

## 2024-02-16 DIAGNOSIS — R809 Proteinuria, unspecified: Secondary | ICD-10-CM

## 2024-02-16 DIAGNOSIS — M26622 Arthralgia of left temporomandibular joint: Secondary | ICD-10-CM | POA: Diagnosis not present

## 2024-02-16 DIAGNOSIS — Z0000 Encounter for general adult medical examination without abnormal findings: Secondary | ICD-10-CM | POA: Diagnosis not present

## 2024-02-16 LAB — POCT GLYCOSYLATED HEMOGLOBIN (HGB A1C): Hemoglobin A1C: 6 % — AB (ref 4.0–5.6)

## 2024-02-16 LAB — POCT UA - MICROALBUMIN
Albumin/Creatinine Ratio, Urine, POC: <30
Creatinine, POC: 200 mg/dL
Microalbumin Ur, POC: 80 mg/L

## 2024-02-16 NOTE — Progress Notes (Signed)
 "  Established Patient Office Visit  Patient ID: Virda K Muston, female    DOB: 1956/01/03  Age: 69 y.o. MRN: [id number] PCP: Alvan Dorothyann BIRCH, MD  Chief Complaint  Patient presents with   Diabetes   Hypertension    Subjective:     HPI  Discussed the use of AI scribe software for clinical note transcription with the patient, who gave verbal consent to proceed.  History of Present Illness Anikka Marsan is a 69 year old female who presents for routine follow-up and blood work.  Glycemic control - Most recent hemoglobin A1c is 6.0 - No hypoglycemic symptoms since last visit - Currently taking metformin  - Making efforts to eat healthily, with increased difficulty during holidays  Pulmonary symptoms - History of emphysema - Scheduled for lung scan on Monday - No shortness of breath this winter - No respiratory infections this winter - Albuterol  inhaler prescribed but not used  Mood and sleep disturbances - Taking Effexor  for mood management - No mood concerns - Taking Ambien  for sleep - Wakes up refreshed without side effects - Insurance covers Ambien  with a small copay every three months  Cognitive concerns - Concerned about memory issues due to family history of Alzheimer's disease (father's brother and sister affected) - Vigilant about memory - No significant memory struggles  Preventive care - Received flu vaccination in September - Foot exam performed earlier in the year  Reports Left TMJ has been cracking and popping over the last week     ROS    Objective:     BP 125/71   Pulse 95   Ht 5' 3 (1.6 m)   Wt 133 lb (60.3 kg)   SpO2 97%   BMI 23.56 kg/m    Physical Exam Vitals and nursing note reviewed.  Constitutional:      Appearance: Normal appearance.  HENT:     Head: Normocephalic and atraumatic.  Eyes:     Conjunctiva/sclera: Conjunctivae normal.  Cardiovascular:     Rate and Rhythm: Normal rate and regular rhythm.   Pulmonary:     Effort: Pulmonary effort is normal.     Breath sounds: Normal breath sounds.  Skin:    General: Skin is warm and dry.     Comments: Cold sores on lower lip  Neurological:     Mental Status: She is alert.  Psychiatric:        Mood and Affect: Mood normal.      Results for orders placed or performed in visit on 02/16/24  POCT HgB A1C  Result Value Ref Range   Hemoglobin A1C 6.0 (A) 4.0 - 5.6 %   HbA1c POC (<> result, manual entry)     HbA1c, POC (prediabetic range)     HbA1c, POC (controlled diabetic range)    POCT UA - Microalbumin  Result Value Ref Range   Microalbumin Ur, POC 80 mg/L   Creatinine, POC 200 mg/dL   Albumin/Creatinine Ratio, Urine, POC <30       The 10-year ASCVD risk score (Arnett DK, et al., 2019) is: 32.1%    Assessment & Plan:   Problem List Items Addressed This Visit       Cardiovascular and Mediastinum   HYPERTENSION, BENIGN   Relevant Orders   POCT HgB A1C (Completed)   CMP14+EGFR     Respiratory   Emphysema lung (HCC)   Relevant Orders   POCT HgB A1C (Completed)   CMP14+EGFR     Endocrine   Microalbuminuria  due to type 2 diabetes mellitus (HCC)   Relevant Orders   POCT HgB A1C (Completed)   CMP14+EGFR   POCT UA - Microalbumin (Completed)   Other Visit Diagnoses       Controlled type 2 diabetes mellitus without complication, without long-term current use of insulin (HCC)    -  Primary   Relevant Orders   POCT HgB A1C (Completed)   CMP14+EGFR   POCT UA - Microalbumin (Completed)     TMJ tenderness, left           Assessment and Plan Assessment & Plan Adult Wellness Visit Routine wellness visit with no acute concerns. Blood pressure well-controlled. Discussed importance of regular exercise, healthy eating, and Medicare Wellness visit. - Ordered up-to-date blood work. - Scheduled Medicare Wellness visit with Jon. - Encouraged regular exercise and healthy eating.  Type 2 diabetes mellitus A1c at 6.0  indicates good glycemic control. Discussed maintaining a healthy diet. - Continue current diabetes management plan. - Monitor for hypoglycemic symptoms and adjust metformin  if necessary.  Essential hypertension Blood pressure well-controlled with current medication. - Continue current antihypertensive medication regimen.  Emphysema Noted on chest film. No recent respiratory infections or exacerbations. - Keep albuterol  inhaler available for potential respiratory infections.  Major depressive disorder, recurrent episode Mood stable with current treatment. - Continue current treatment with Effexor .  Left TMJ - recommend avoid overchewing, antinflammatory, and f/u if nto improving.   Return in about 6 months (around 08/15/2024) for Diabetes follow-up.    Dorothyann Byars, MD [facility] at [facility]   "

## 2024-02-16 NOTE — Progress Notes (Signed)
 "  Chief Complaint  Patient presents with   Medicare Wellness     Subjective:   Krista Love is a 69 y.o. female who presents for a Medicare Annual Wellness Visit.  Visit info / Clinical Intake: Medicare Wellness Visit Type:: Subsequent Annual Wellness Visit Persons participating in visit and providing information:: patient Medicare Wellness Visit Mode:: In-person (required for WTM) Interpreter Needed?: No Pre-visit prep was completed: yes AWV questionnaire completed by patient prior to visit?: no Living arrangements:: with family/others Patient's Overall Health Status Rating: very good Typical amount of pain: none Does pain affect daily life?: no Are you currently prescribed opioids?: no  Dietary Habits and Nutritional Risks How many meals a day?: 2 Eats fruit and vegetables daily?: yes Most meals are obtained by: preparing own meals In the last 2 weeks, have you had any of the following?: none Diabetic:: no  Functional Status Activities of Daily Living (to include ambulation/medication): Independent Ambulation: Independent Medication Administration: Independent Home Management (perform basic housework or laundry): Independent Manage your own finances?: yes Primary transportation is: driving Concerns about vision?: no *vision screening is required for WTM* Concerns about hearing?: no  Fall Screening Falls in the past year?: 0 Number of falls in past year: 0 Was there an injury with Fall?: 0 Fall Risk Category Calculator: 0 Patient Fall Risk Level: Low Fall Risk  Fall Risk Patient at Risk for Falls Due to: No Fall Risks Fall risk Follow up: Falls evaluation completed  Home and Transportation Safety: All rugs have non-skid backing?: yes All stairs or steps have railings?: yes Grab bars in the bathtub or shower?: (!) no Have non-skid surface in bathtub or shower?: (!) no Good home lighting?: yes Regular seat belt use?: yes Hospital stays in the last year::  no  Cognitive Assessment Difficulty concentrating, remembering, or making decisions? : yes Will 6CIT or Mini Cog be Completed: yes What year is it?: 0 points What month is it?: 0 points Give patient an address phrase to remember (5 components): [address] [place], [place] About what time is it?: 0 points Count backwards from 20 to 1: 0 points Say the months of the year in reverse: 0 points Repeat the address phrase from earlier: 0 points 6 CIT Score: 0 points  Advance Directives (For Healthcare) Does Patient Have a Medical Advance Directive?: Yes Does patient want to make changes to medical advance directive?: No - Patient declined Type of Advance Directive: Healthcare Power of [name]; Living will  Reviewed/Updated  Reviewed/Updated: Medical History; Surgical History; Medications; Allergies; Care Teams; Patient Goals    Allergies (verified) Codeine   Current Medications (verified) Outpatient Encounter Medications as of 02/16/2024  Medication Sig   ACCU-CHEK GUIDE test strip USE TO CHECK BLOOD SUGAR IN THE MORNING AND 2 HOURS  AFTER LARGEST MEAL OF THE  DAY 3 TIMES WEEKLY   Accu-Chek Softclix Lancets lancets Dx DM E11.9. Check fasting blood sugar every morning and 2 hours after largest meal.   losartan  (COZAAR ) 50 MG tablet TAKE 1 TABLET BY MOUTH DAILY   metFORMIN  (GLUCOPHAGE ) 1000 MG tablet TAKE 1 TABLET BY MOUTH  TWICE DAILY WITH MEALS   rosuvastatin  (CRESTOR ) 40 MG tablet TAKE 1 TABLET BY MOUTH DAILY   venlafaxine  XR (EFFEXOR -XR) 150 MG 24 hr capsule TAKE 1 CAPSULE BY MOUTH DAILY  WITH BREAKFAST   zolpidem  (AMBIEN ) 5 MG tablet Take 1 tablet (5 mg total) by mouth at bedtime as needed for sleep.   No facility-administered encounter medications on file  as of 02/16/2024.    History: Past Medical History:  Diagnosis Date   Diabetes mellitus    Hyperlipidemia    Hypertension    Past Surgical History:  Procedure Laterality Date   back surgery for a ruptured disc      Family History  Problem Relation Age of Onset   Hypertension Mother    Diabetes Brother    Cancer Maternal Grandfather        skin   Stomach cancer Sister    Heart disease Sister 3       stents.    Social History   Occupational History   Occupation: [employer]    Employer: [employer]   Occupation: Retired.  Tobacco Use   Smoking status: Every Day    Current packs/day: 1.00    Types: Cigarettes   Smokeless tobacco: Never  Vaping Use   Vaping status: Never Used  Substance and Sexual Activity   Alcohol use: Yes   Drug use: No   Sexual activity: Not Currently   Tobacco Counseling Ready to quit: Not Answered Counseling given: Not Answered  SDOH Screenings   Food Insecurity: No Food Insecurity (02/16/2024)  Housing: Low Risk (02/16/2024)  Transportation Needs: No Transportation Needs (02/16/2024)  Utilities: Not At Risk (02/16/2024)  Alcohol Screen: Low Risk (10/19/2022)  Depression (PHQ2-9): Low Risk (02/16/2024)  Financial Resource Strain: Low Risk (10/19/2022)  Physical Activity: Inactive (02/16/2024)  Social Connections: Socially Isolated (02/16/2024)  Stress: No Stress Concern Present (02/16/2024)  Tobacco Use: High Risk (02/16/2024)  Health Literacy: Adequate Health Literacy (02/16/2024)   See flowsheets for full screening details  Depression Screen PHQ 2 & 9 Depression Scale- Over the past 2 weeks, how often have you been bothered by any of the following problems? Little interest or pleasure in doing things: 0 Feeling down, depressed, or hopeless (PHQ Adolescent also includes...irritable): 0 PHQ-2 Total Score: 0 Trouble falling or staying asleep, or sleeping too much: 0 Feeling tired or having little energy: 1 Poor appetite or overeating (PHQ Adolescent also includes...weight loss): 0 Feeling bad about yourself - or that you are a failure or have let yourself or your family down: 0 Trouble concentrating on things, such as reading the newspaper  or watching television (PHQ Adolescent also includes...like school work): 0 Moving or speaking so slowly that other people could have noticed. Or the opposite - being so fidgety or restless that you have been moving around a lot more than usual: 1 Thoughts that you would be better off dead, or of hurting yourself in some way: 0 PHQ-9 Total Score: 2 If you checked off any problems, how difficult have these problems made it for you to do your work, take care of things at home, or get along with other people?: Not difficult at all  Depression Treatment Depression Interventions/Treatment : [id number] Score <4 Follow-up Not Indicated     Goals Addressed             This Visit's Progress    Patient Stated       Patient states she would like to exercise more often.              Objective:    Today's Vitals   02/16/24 1056  BP: 125/71  Pulse: 95  SpO2: 97%  Weight: 133 lb (60.3 kg)  Height: 5' 3 (1.6 m)   Body mass index is 23.56 kg/m.  Hearing/Vision screen No results found. Immunizations and Health Maintenance Health Maintenance  Topic Date Due   COVID-19 Vaccine (3 - Moderna risk series) 01/04/2020   OPHTHALMOLOGY EXAM  05/31/2024   Diabetic kidney evaluation - eGFR measurement  06/13/2024   HEMOGLOBIN A1C  08/15/2024   FOOT EXAM  10/16/2024   Diabetic kidney evaluation - Urine ACR  02/15/2025   Mammogram  02/15/2025   Medicare Annual Wellness (AWV)  02/15/2025   Bone Density Scan  10/29/2026   DTaP/Tdap/Td (3 - Td or Tdap) 02/09/2027   Colonoscopy  11/28/2029   Pneumococcal Vaccine: 50+ Years  Completed   Influenza Vaccine  Completed   Hepatitis C Screening  Completed   Zoster Vaccines- Shingrix   Completed   Meningococcal B Vaccine  Aged Out        Assessment/Plan:  This is a routine wellness examination for [place].  Patient Care Team: Alvan Dorothyann BIRCH, MD as PCP - General Rudy Greig GAILS, OD as Referring Physician (Optometry) Beverli Cara PARAS, [facility]  (Inactive) as Pharmacist (Pharmacist)  I have personally reviewed and noted the following in the patients chart:   Medical and social history Use of alcohol, tobacco or illicit drugs  Current medications and supplements including opioid prescriptions. Functional ability and status Nutritional status Physical activity Advanced directives List of other physicians Hospitalizations, surgeries, and ER visits in previous 12 months Vitals Screenings to include cognitive, depression, and falls Referrals and appointments  No orders of the defined types were placed in this encounter.  In addition, I have reviewed and discussed with patient certain preventive protocols, quality metrics, and best practice recommendations. A written personalized care plan for preventive services as well as general preventive health recommendations were provided to patient.   Bonny Jon Mayor, CMA   02/16/2024   Return in 1 year (on 02/15/2025).  After Visit Summary: (In Person-Declined) Patient declined AVS at this time.  Nurse Notes:   Krista Love is a 69 y.o. female patient of Dorothyann Alvan, MD who had a Medicare Annual Wellness Visit today. She reports that she is socially active and does interact with friends/family regularly. She is moderately physically active.   "

## 2024-02-16 NOTE — Patient Instructions (Signed)
 Krista Love,  Thank you for taking the time for your Medicare Wellness Visit. I appreciate your continued commitment to your health goals. Please review the care plan we discussed, and feel free to reach out if I can assist you further.  Please note that Annual Wellness Visits do not include a physical exam. Some assessments may be limited, especially if the visit was conducted virtually. If needed, we may recommend an in-person follow-up with your provider.  Ongoing Care Seeing your primary care provider every 3 to 6 months helps us  monitor your health and provide consistent, personalized care.   Referrals If a referral was made during today's visit and you haven't received any updates within two weeks, please contact the referred provider directly to check on the status.  Recommended Screenings:  Health Maintenance  Topic Date Due   COVID-19 Vaccine (3 - Moderna risk series) 01/04/2020   Medicare Annual Wellness Visit  10/19/2023   Eye exam for diabetics  05/31/2024   Yearly kidney function blood test for diabetes  06/13/2024   Hemoglobin A1C  08/15/2024   Complete foot exam   10/16/2024   Kidney health urinalysis for diabetes  02/15/2025   Breast Cancer Screening  02/15/2025   Osteoporosis screening with Bone Density Scan  10/29/2026   DTaP/Tdap/Td vaccine (3 - Td or Tdap) 02/09/2027   Colon Cancer Screening  11/28/2029   Pneumococcal Vaccine for age over 35  Completed   Flu Shot  Completed   Hepatitis C Screening  Completed   Zoster (Shingles) Vaccine  Completed   Meningitis B Vaccine  Aged Out       02/16/2024   10:59 AM  Advanced Directives  Does Patient Have a Medical Advance Directive? Yes  Type of Estate Agent of [place];Living will  Does patient want to make changes to medical advance directive? No - Patient declined    Vision: Annual vision screenings are recommended for early detection of glaucoma, cataracts, and diabetic retinopathy. These  exams can also reveal signs of chronic conditions such as diabetes and high blood pressure.  Dental: Annual dental screenings help detect early signs of oral cancer, gum disease, and other conditions linked to overall health, including heart disease and diabetes.  Please see the attached documents for additional preventive care recommendations.

## 2024-02-17 ENCOUNTER — Ambulatory Visit: Payer: Self-pay | Admitting: Family Medicine

## 2024-02-17 LAB — CMP14+EGFR
ALT: 14 [IU]/L (ref 0–32)
AST: 15 [IU]/L (ref 0–40)
Albumin: 4.4 g/dL (ref 3.9–4.9)
Alkaline Phosphatase: 83 [IU]/L (ref 49–135)
BUN/Creatinine Ratio: 19 (ref 12–28)
BUN: 13 mg/dL (ref 8–27)
Bilirubin Total: <0.2 mg/dL (ref 0.0–1.2)
CO2: 25 mmol/L (ref 20–29)
Calcium: 9.6 mg/dL (ref 8.7–10.3)
Chloride: 101 mmol/L (ref 96–106)
Creatinine, Ser: 0.67 mg/dL (ref 0.57–1.00)
Globulin, Total: 2.2 g/dL (ref 1.5–4.5)
Glucose: 158 mg/dL — ABNORMAL HIGH (ref 70–99)
Potassium: 4.4 mmol/L (ref 3.5–5.2)
Sodium: 140 mmol/L (ref 134–144)
Total Protein: 6.6 g/dL (ref 6.0–8.5)
eGFR: 94 mL/min/{1.73_m2}

## 2024-02-17 NOTE — Progress Notes (Signed)
 Your lab work is within acceptable range and there are no concerning findings.   ?

## 2024-02-20 ENCOUNTER — Encounter

## 2024-02-21 ENCOUNTER — Encounter: Payer: Self-pay | Admitting: Family Medicine

## 2024-02-21 DIAGNOSIS — E119 Type 2 diabetes mellitus without complications: Secondary | ICD-10-CM

## 2024-02-22 MED ORDER — METFORMIN HCL 1000 MG PO TABS: 1000.0000 mg | ORAL_TABLET | Freq: Two times a day (BID) | ORAL | 2 refills | Status: AC

## 2024-02-22 NOTE — Telephone Encounter (Signed)
 Requesting rx rf of metformin  1000mg  [facility] Last written 07/06/2021 Last OV 02/16/2024 Upcoming appt 08/15/2024

## 2024-02-29 ENCOUNTER — Encounter

## 2024-08-15 ENCOUNTER — Ambulatory Visit: Admitting: Family Medicine
